# Patient Record
Sex: Male | Born: 1950
Health system: Southern US, Community
[De-identification: ages and names within clinical notes are randomized; demographics above are authoritative.]

## PROBLEM LIST (undated history)

## (undated) DIAGNOSIS — R011 Cardiac murmur, unspecified: Secondary | ICD-10-CM

## (undated) DIAGNOSIS — R51 Headache: Secondary | ICD-10-CM

## (undated) DIAGNOSIS — R002 Palpitations: Secondary | ICD-10-CM

## (undated) DIAGNOSIS — F329 Major depressive disorder, single episode, unspecified: Secondary | ICD-10-CM

## (undated) DIAGNOSIS — I10 Essential (primary) hypertension: Secondary | ICD-10-CM

## (undated) DIAGNOSIS — R52 Pain, unspecified: Secondary | ICD-10-CM

## (undated) DIAGNOSIS — F32A Depression, unspecified: Secondary | ICD-10-CM

## (undated) DIAGNOSIS — I480 Paroxysmal atrial fibrillation: Secondary | ICD-10-CM

## (undated) DIAGNOSIS — I4892 Unspecified atrial flutter: Secondary | ICD-10-CM

## (undated) DIAGNOSIS — F419 Anxiety disorder, unspecified: Secondary | ICD-10-CM

## (undated) DIAGNOSIS — J45909 Unspecified asthma, uncomplicated: Secondary | ICD-10-CM

## (undated) DIAGNOSIS — K219 Gastro-esophageal reflux disease without esophagitis: Secondary | ICD-10-CM

## (undated) DIAGNOSIS — C801 Malignant (primary) neoplasm, unspecified: Secondary | ICD-10-CM

## (undated) DIAGNOSIS — Z9889 Other specified postprocedural states: Secondary | ICD-10-CM

## (undated) DIAGNOSIS — R112 Nausea with vomiting, unspecified: Secondary | ICD-10-CM

## (undated) DIAGNOSIS — D492 Neoplasm of unspecified behavior of bone, soft tissue, and skin: Secondary | ICD-10-CM

## (undated) HISTORY — PX: HERNIA REPAIR: SHX51

## (undated) HISTORY — DX: Paroxysmal atrial fibrillation: I48.0

## (undated) HISTORY — PX: OTHER SURGICAL HISTORY: SHX169

---
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
## 1999-05-26 ENCOUNTER — Encounter: Payer: Self-pay | Admitting: Emergency Medicine

## 1999-05-26 ENCOUNTER — Emergency Department (HOSPITAL_COMMUNITY): Admission: EM | Admit: 1999-05-26 | Discharge: 1999-05-26 | Payer: Self-pay | Admitting: *Deleted

## 2007-05-11 ENCOUNTER — Encounter (INDEPENDENT_AMBULATORY_CARE_PROVIDER_SITE_OTHER): Payer: Self-pay | Admitting: Surgery

## 2007-05-11 ENCOUNTER — Ambulatory Visit (HOSPITAL_BASED_OUTPATIENT_CLINIC_OR_DEPARTMENT_OTHER): Admission: RE | Admit: 2007-05-11 | Discharge: 2007-05-11 | Payer: Self-pay | Admitting: Surgery

## 2009-01-30 ENCOUNTER — Emergency Department (HOSPITAL_COMMUNITY): Admission: EM | Admit: 2009-01-30 | Discharge: 2009-01-30 | Payer: Self-pay | Admitting: Emergency Medicine

## 2009-05-27 ENCOUNTER — Emergency Department (HOSPITAL_COMMUNITY): Admission: EM | Admit: 2009-05-27 | Discharge: 2009-05-28 | Payer: Self-pay | Admitting: Emergency Medicine

## 2010-12-26 LAB — CBC
HCT: 39.7 % (ref 39.0–52.0)
Hemoglobin: 13.5 g/dL (ref 13.0–17.0)
MCHC: 34 g/dL (ref 30.0–36.0)
MCV: 93.2 fL (ref 78.0–100.0)
Platelets: 124 10*3/uL — ABNORMAL LOW (ref 150–400)
RBC: 4.26 MIL/uL (ref 4.22–5.81)
RDW: 12.6 % (ref 11.5–15.5)
WBC: 6.3 10*3/uL (ref 4.0–10.5)

## 2010-12-26 LAB — URINALYSIS, ROUTINE W REFLEX MICROSCOPIC
Bilirubin Urine: NEGATIVE
Glucose, UA: NEGATIVE mg/dL
Hgb urine dipstick: NEGATIVE
Ketones, ur: NEGATIVE mg/dL
Nitrite: NEGATIVE
Protein, ur: NEGATIVE mg/dL
Specific Gravity, Urine: 1.016 (ref 1.005–1.030)
Urobilinogen, UA: 0.2 mg/dL (ref 0.0–1.0)
pH: 6 (ref 5.0–8.0)

## 2010-12-26 LAB — DIFFERENTIAL
Basophils Absolute: 0 10*3/uL (ref 0.0–0.1)
Basophils Relative: 0 % (ref 0–1)
Eosinophils Absolute: 0.1 10*3/uL (ref 0.0–0.7)
Eosinophils Relative: 1 % (ref 0–5)
Lymphocytes Relative: 21 % (ref 12–46)
Lymphs Abs: 1.3 10*3/uL (ref 0.7–4.0)
Monocytes Absolute: 0.3 10*3/uL (ref 0.1–1.0)
Monocytes Relative: 5 % (ref 3–12)
Neutro Abs: 4.6 10*3/uL (ref 1.7–7.7)
Neutrophils Relative %: 73 % (ref 43–77)

## 2010-12-26 LAB — RAPID URINE DRUG SCREEN, HOSP PERFORMED
Amphetamines: NOT DETECTED
Barbiturates: NOT DETECTED
Benzodiazepines: POSITIVE — AB
Cocaine: NOT DETECTED
Opiates: NOT DETECTED
Tetrahydrocannabinol: NOT DETECTED

## 2010-12-26 LAB — COMPREHENSIVE METABOLIC PANEL
ALT: 17 U/L (ref 0–53)
AST: 24 U/L (ref 0–37)
Albumin: 4.2 g/dL (ref 3.5–5.2)
Alkaline Phosphatase: 44 U/L (ref 39–117)
BUN: 13 mg/dL (ref 6–23)
CO2: 27 mEq/L (ref 19–32)
Calcium: 8.9 mg/dL (ref 8.4–10.5)
Chloride: 103 mEq/L (ref 96–112)
Creatinine, Ser: 0.89 mg/dL (ref 0.4–1.5)
GFR calc Af Amer: >60 mL/min (ref >60)
GFR calc non Af Amer: >60 mL/min (ref >60)
Glucose, Bld: 109 mg/dL — ABNORMAL HIGH (ref 70–99)
Potassium: 3.4 mEq/L — ABNORMAL LOW (ref 3.5–5.1)
Sodium: 134 mEq/L — ABNORMAL LOW (ref 135–145)
Total Bilirubin: 0.9 mg/dL (ref 0.3–1.2)
Total Protein: 7.3 g/dL (ref 6.0–8.3)

## 2010-12-26 LAB — ETHANOL: Alcohol, Ethyl (B): <5 mg/dL (ref 0–10)

## 2010-12-30 LAB — CK TOTAL AND CKMB (NOT AT ARMC)
CK, MB: 3.1 ng/mL (ref 0.3–4.0)
CK, MB: 3.5 ng/mL (ref 0.3–4.0)
Relative Index: 2.9 — ABNORMAL HIGH (ref 0.0–2.5)
Relative Index: 2.9 — ABNORMAL HIGH (ref 0.0–2.5)
Total CK: 107 U/L (ref 7–232)
Total CK: 120 U/L (ref 7–232)

## 2010-12-30 LAB — DIFFERENTIAL
Basophils Absolute: 0 10*3/uL (ref 0.0–0.1)
Basophils Relative: 0 % (ref 0–1)
Eosinophils Absolute: 0 10*3/uL (ref 0.0–0.7)
Eosinophils Relative: 1 % (ref 0–5)
Lymphocytes Relative: 26 % (ref 12–46)
Lymphs Abs: 1.2 10*3/uL (ref 0.7–4.0)
Monocytes Absolute: 0.3 10*3/uL (ref 0.1–1.0)
Monocytes Relative: 7 % (ref 3–12)
Neutro Abs: 3.1 10*3/uL (ref 1.7–7.7)
Neutrophils Relative %: 66 % (ref 43–77)

## 2010-12-30 LAB — COMPREHENSIVE METABOLIC PANEL
ALT: 20 U/L (ref 0–53)
AST: 26 U/L (ref 0–37)
Albumin: 4.2 g/dL (ref 3.5–5.2)
Alkaline Phosphatase: 43 U/L (ref 39–117)
BUN: 12 mg/dL (ref 6–23)
CO2: 30 mEq/L (ref 19–32)
Calcium: 9.4 mg/dL (ref 8.4–10.5)
Chloride: 105 mEq/L (ref 96–112)
Creatinine, Ser: 0.98 mg/dL (ref 0.4–1.5)
GFR calc Af Amer: >60 mL/min (ref >60)
GFR calc non Af Amer: >60 mL/min (ref >60)
Glucose, Bld: 95 mg/dL (ref 70–99)
Potassium: 3.6 mEq/L (ref 3.5–5.1)
Sodium: 141 mEq/L (ref 135–145)
Total Bilirubin: 1.2 mg/dL (ref 0.3–1.2)
Total Protein: 7 g/dL (ref 6.0–8.3)

## 2010-12-30 LAB — TROPONIN I
Troponin I: 0.01 ng/mL (ref 0.00–0.06)
Troponin I: <0.01 ng/mL (ref 0.00–0.06)

## 2010-12-30 LAB — CBC
HCT: 40 % (ref 39.0–52.0)
Hemoglobin: 13.9 g/dL (ref 13.0–17.0)
MCHC: 34.7 g/dL (ref 30.0–36.0)
MCV: 91.2 fL (ref 78.0–100.0)
Platelets: 127 10*3/uL — ABNORMAL LOW (ref 150–400)
RBC: 4.39 MIL/uL (ref 4.22–5.81)
RDW: 12.7 % (ref 11.5–15.5)
WBC: 4.7 10*3/uL (ref 4.0–10.5)

## 2011-02-03 NOTE — Op Note (Signed)
NAME:  Tommy [name], Tommy [name]                ACCOUNT NO.:  [id number]   MEDICAL RECORD NO.:  [id number]          PATIENT TYPE:  AMB   LOCATION:  DSC                          FACILITY:  [facility]   PHYSICIAN:  Thomas A. Cornett, M.D.DATE OF BIRTH:  Apr 02, 1951   DATE OF PROCEDURE:  05/11/2007  DATE OF DISCHARGE:                               OPERATIVE REPORT   PREOPERATIVE DIAGNOSIS:  Left axillary mass, probable lipoma, measuring  4 x 5 cm.   POSTOPERATIVE DIAGNOSIS:  Left axillary mass, probable lipoma, measuring  4 x 5 cm.   PROCEDURE:  Excision of left axillary mass.   SURGEON:  Harriette Bouillon, M.D.   ASSISTANT:  None.   ANESTHESIA:  MAC with 0.25% Sensorcaine local.   ESTIMATED BLOOD LOSS:  5 mL.   SPECIMEN:  4 x 5 cm fatty mass from left axilla probable lipoma.   DRAINS:  None.   INDICATIONS FOR PROCEDURE:  The patient is a 60 year old male with a  slowly enlarging mass in his left axilla.  This has been causing him  some symptoms.  He wished to have it removed today.  Informed consent  was obtained.   DESCRIPTION OF PROCEDURE:  The patient was brought to the operating  room, placed supine.  After induction of MAC anesthesia, left axilla was  prepped and draped in sterile fashion.  0.25% Sensorcaine was used and a  lobular mass was in the anterior axillary line.  Incision was made over  it.  Dissection was carried in the subcu fat and what appeared to be a  lipoma was excised measuring 4 x 5 cm.  Hemostasis was achieved with  cautery.  Wound was closed in layers with 3-0 and Vicryl 4-0 Monocryl  subcuticular stitch.  Dermabond was placed as a dressing.  All final  counts of sponge, needle and instruments found to be correct this  portion of case.  The patient was awoke taken to recovery in  satisfactory condition.      Thomas A. Cornett, M.D.  Electronically Signed     TAC/MEDQ  D:  05/11/2007  T:  05/12/2007  Job:  [id number]

## 2011-02-03 NOTE — Consult Note (Signed)
NAME:  Tommy [name], Tommy [name]                ACCOUNT NO.:  [id number]   MEDICAL RECORD NO.:  [id number]          PATIENT TYPE:  EMS   LOCATION:  ED                           FACILITY:  [facility]   PHYSICIAN:  Thereasa Solo. Little, M.D. DATE OF BIRTH:  August 18, 1951   DATE OF CONSULTATION:  01/30/2009  DATE OF DISCHARGE:  01/30/2009                                 CONSULTATION   HISTORY OF PRESENT ILLNESS:  This 60 year old male presents to [facility] Emergency Room with palpitations.  He has negative troponins and  low CK with slightly high MBs for relative index of greater than 2.5.   His history dates back about 10 years ago when he began having episodes  of palpitations.  He was placed on brand name Toprol and did wonderful  with no recurrent problems.  He recently switched from Toprol to  metoprolol subsequently, and when this transition was made, he began  noticing more ectopy.  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  He became anxious  with this.  It stopped on its own and he was fine last night.  He slept  appropriately last night, felt good this morning, and then for no  particular reason began having palpitations this afternoon.  He was  actually able to work out last night with no symptoms.  The palpitations  he describe sound more like occasional PVCs, nothing that appears to be  fast.  No associated dizziness or lightheadedness.  He has had no  exertional chest pain, he has no prior history of coronary artery  disease.  He is 6 feet 7 inches and his mother was also tall, but there  is no history of Marfan syndrome in his family.  His mother had heart  disease including coronary artery disease and atrial fibrillation, but  all these started in her 76s.   He had problems in the past with stress, had been on Zoloft ranging from  50-125 mg a day.  At the  higher doses, he had pressure sensation in his  bladder and he recently tapered himself off the 50 mg Zoloft.  He has  been taking low-dose triamterene intermittently, recently.   He is on significant emotional stressors with his mother dying in  February.  His financial situation is rather bleak.  He lives with his  brother and is looking for a girlfriend on the internet.   He is on appropriate reduced salt, reduced fat diet.  He does not use  caffeine very often, but he has noticed in the past when he drinks  caffeinated beverages he feels hyped up.  He did eat chocolate candy  yesterday at [employer], and a coke.   PAST HISTORY:  Two benign tumors, 1 from his arm and 1 from his leg; and  bilateral hernia repairs x2, one has mesh.   He has PSA issues, 7.1 is the highest, it is currently 6.5.  He has seen  Dr. Annabell Howells in the past for this.   As a child, he had asthma, and was diagnosed about 10 years ago as  having some mild degree of COPD/asthma by a chest x-ray on hospital.  I  have no documentation of this.   REVIEW OF SYSTEMS:  He has had no lower extremity edema.  He does not  have sclerodactyly.  He has never been worked up for [employer].  He has  no evidence of any heart murmur.  He has been physically unlimited,  however.  He denies sleep apnea.  Does not have a productive cough,  fever, chills, or night sweats.  Normal bowel habits.  No blood in his  bowel movements.  If he eats red meat and when he takes  high-dose  Zoloft, he occasionally has nosebleeds.  He gives a history of migraine  headaches also.   PHYSICAL EXAMINATION:  VITAL SIGNS:  His initial blood pressure in the  emergency room was 150/95.  It was checked about 2 hours later, and it  is down to 114/75 with no therapy.  His initial heart rate was 91.  He  had also slowed to 64.  Respiratory rate was 18.  His oxygen saturation  was 100%.  GENERAL:  He has hyperpigmentation over his lower extremities at the   level of the socks.  He has no pitting edema.  He has excellent pulses  in the upper and lower extremities.  He is not double-jointed.  His  carotids are without bruits.  NECK:  Supple.  There was no anterior cervical adenopathy.  His thyroid  was not palpable.  LUNGS:  Clear to auscultation and forced expiration did not induce a  wheeze.  CARDIAC:  Both supine and sitting failed to show a murmur.  He did have  what sounds like an intermittent S4 gallop.  ABDOMEN:  Soft.  No bruits.  No organomegaly.  Liver is not palpable.  He had no rashes.  NEUROLOGIC:  Other than being anxious, he was normal.   His EKG shows a sinus rhythm with a rate of 65 and he had a single  isolated PVC.   On personal review of his lab work, he has a potassium of 3.6, BUN and  creatinine of 12 and 0.98 respectively.  His first troponin was less  than 0.1.  His CK was 120 with an MB of 3.  Repeat CK was 107 with an MB  of 3 and again a troponin of 0.1.   ASSESSMENT:  1. Benign palpitations which seems to be chronic, perhaps exacerbated      by some stress and/or caffeine.  I have asked him to go back on his      Toprol-XL 50 mg once a day, brand name only.  2. Stress.  With his mother's recent death and his social situation, I      recommended to go back on Zoloft 50 mg which he has at home.  In      addition, he has Tranxene 3.75 mg and I asked him to increase this      to 7.5 mg once a day, particularly at bedtime, so he can rest      appropriately.   At some point in the new feature, he will probably need an  echocardiogram and a cardiac stress test.  My only concern is because of  his height is that he could potentially be marfanoid but he has no other  stigmata and I  did not appreciate any kind of murmur.   The patient has now reported allergies.  His medications at home are  blood pressure metoprolol succinate 50 mg once a day, Zoloft 50 mg, but  he has not taken in several weeks, and Tranxene 3.75  p.r.n.  He is to  follow up with Dr. Juleen China.           ______________________________  Thereasa Solo. Little, M.D.     ABL/MEDQ  D:  01/30/2009  T:  01/31/2009  Job:  [id number]   cc:   [facility]   Dr. Adela Lank

## 2011-07-03 LAB — CBC
HCT: 37.9 — ABNORMAL LOW
Hemoglobin: 13.1
MCHC: 34.7
MCV: 90.9
Platelets: 145 — ABNORMAL LOW
RBC: 4.17 — ABNORMAL LOW
RDW: 12.9
WBC: 4.8

## 2011-07-03 LAB — DIFFERENTIAL
Basophils Absolute: 0
Basophils Relative: 0
Eosinophils Absolute: 0.1
Eosinophils Relative: 2
Lymphocytes Relative: 30
Lymphs Abs: 1.4
Monocytes Absolute: 0.3
Monocytes Relative: 7
Neutro Abs: 2.9
Neutrophils Relative %: 61

## 2011-07-03 LAB — BASIC METABOLIC PANEL
BUN: 13
CO2: 28
Calcium: 9.1
Chloride: 104
Creatinine, Ser: 0.9
GFR calc Af Amer: >60
GFR calc non Af Amer: >60
Glucose, Bld: 95
Potassium: 4
Sodium: 139

## 2012-06-19 ENCOUNTER — Emergency Department (HOSPITAL_COMMUNITY)
Admission: EM | Admit: 2012-06-19 | Discharge: 2012-06-19 | Disposition: A | Discharge status: Home / Self Care | Payer: Worker's Compensation | Attending: Emergency Medicine | Admitting: Emergency Medicine

## 2012-06-19 ENCOUNTER — Emergency Department (HOSPITAL_COMMUNITY): Payer: Worker's Compensation

## 2012-06-19 ENCOUNTER — Encounter (HOSPITAL_COMMUNITY): Payer: Self-pay | Admitting: Emergency Medicine

## 2012-06-19 DIAGNOSIS — X503XXA Overexertion from repetitive movements, initial encounter: Secondary | ICD-10-CM | POA: Insufficient documentation

## 2012-06-19 DIAGNOSIS — R002 Palpitations: Secondary | ICD-10-CM | POA: Insufficient documentation

## 2012-06-19 DIAGNOSIS — Z87891 Personal history of nicotine dependence: Secondary | ICD-10-CM | POA: Insufficient documentation

## 2012-06-19 DIAGNOSIS — S2341XA Sprain of ribs, initial encounter: Secondary | ICD-10-CM

## 2012-06-19 DIAGNOSIS — I10 Essential (primary) hypertension: Secondary | ICD-10-CM | POA: Insufficient documentation

## 2012-06-19 DIAGNOSIS — J45909 Unspecified asthma, uncomplicated: Secondary | ICD-10-CM | POA: Insufficient documentation

## 2012-06-19 HISTORY — DX: Unspecified asthma, uncomplicated: J45.909

## 2012-06-19 HISTORY — DX: Essential (primary) hypertension: I10

## 2012-06-19 HISTORY — DX: Neoplasm of unspecified behavior of bone, soft tissue, and skin: D49.2

## 2012-06-19 HISTORY — DX: Palpitations: R00.2

## 2012-06-19 NOTE — ED Notes (Signed)
Pt reports this as a work related injury.

## 2012-06-19 NOTE — ED Provider Notes (Signed)
History     CSN: [id number]  Arrival date & time 06/19/12  1532   First MD Initiated Contact with Patient 06/19/12 1723      Chief Complaint  Patient presents with  . Rib Injury    (Consider location/radiation/quality/duration/timing/severity/associated sxs/prior treatment) HPI Comments: 61 year old male presents emergency department with left-sided rib pain since Friday after moving heavy boxes at work. States he was pushing a box for her move left arm when he felt a pain in the left side of his ribs. Since then the pain has been intermittent, worse with taking a deep breath in. Pain rated for out of 10. He try taking ibuprofen and Tylenol with some relief. Denies any shortness of breath, chest pain, nausea, vomiting, diaphoresis, rashes, lightheadedness, dizziness, fever or chills. No color change or bruising around his rib area. Admits to being diagnosed with COPD about 3-4 years ago. He is a nonsmoker.  The history is provided by the patient.    Past Medical History  Diagnosis Date  . Asthma   . Hypertension   . Palpitations   . Bone tumor     Past Surgical History  Procedure Date  . Bone tumor excision     No family history on file.  History  Substance Use Topics  . Smoking status: Former Smoker    Types: Cigarettes, Pipe, Cigars    Quit date: 09/21/1970  . Smokeless tobacco: Never Used  . Alcohol Use: Yes     Occassionally       Review of Systems  Constitutional: Negative for fever, chills, diaphoresis and activity change.  HENT: Negative for neck pain and neck stiffness.   Eyes: Negative for visual disturbance.  Respiratory: Negative for cough, chest tightness and shortness of breath.   Cardiovascular: Negative for chest pain.  Gastrointestinal: Negative for nausea, vomiting and abdominal pain.  Musculoskeletal:       Rib pain  Skin: Negative for color change and rash.  Neurological: Negative for weakness and light-headedness.  Psychiatric/Behavioral:  Negative for confusion.    Allergies  Paxil  Home Medications   Current Outpatient Rx  Name Route Sig Dispense Refill  . CLORAZEPATE DIPOTASSIUM 3.75 MG PO TABS Oral Take 3.75 mg by mouth 2 (two) times daily as needed. For anxiety    . METOPROLOL SUCCINATE ER 50 MG PO TB24 Oral Take 50 mg by mouth 2 (two) times daily.    Marland Kitchen OMEPRAZOLE 20 MG PO CPDR Oral Take 20 mg by mouth daily as needed. For acid reflux      BP 125/79  Pulse 62  Temp 97.8 F (36.6 C) (Oral)  Resp 18  SpO2 100%  Physical Exam  Constitutional: He is oriented to person, place, and time. He appears well-developed and well-nourished. No distress.  HENT:  Head: Normocephalic and atraumatic.  Eyes: Conjunctivae normal and EOM are normal. Pupils are equal, round, and reactive to light.  Neck: Normal range of motion. Neck supple.  Cardiovascular: Normal rate, regular rhythm, normal heart sounds and intact distal pulses.   Pulmonary/Chest: Effort normal and breath sounds normal. No respiratory distress. He has no decreased breath sounds. He exhibits tenderness (over left lateral 11th rib without evidence of step off).  Abdominal: Soft. Bowel sounds are normal. There is no tenderness.  Musculoskeletal: Normal range of motion. He exhibits no edema.  Neurological: He is alert and oriented to person, place, and time.  Skin: Skin is warm and dry. No bruising, no ecchymosis and no rash noted. He is  not diaphoretic. No erythema.  Psychiatric: He has a normal mood and affect. His behavior is normal.    ED Course  Procedures (including critical care time)  Labs Reviewed - No data to display Dg Chest 1 View  06/19/2012  *RADIOLOGY REPORT*  Clinical Data: Evaluate for right lung nodule.  EKG leads and overlying artifacts removed.  CHEST - 1 VIEW  Comparison: Earlier today at 1723 hours  Findings: 1804 hours.  Midline trachea.  Normal heart size.  The costophrenic angles are excluded, but there is no pleural fluid on the exam of  earlier today. No pneumothorax.  Underlying hyperinflation.  The nodular density questioned on the prior exam is not persistent and was artifactual.  Lungs are clear.  IMPRESSION: No evidence of persistent lung nodule.  The density on the prior exam was artifactual.   Original Report Authenticated By: Consuello Bossier, M.D.    Dg Ribs Unilateral W/chest Left  06/19/2012  *RADIOLOGY REPORT*  Clinical Data: Rib injury.  Left-sided pain.  LEFT RIBS AND CHEST - 3+ VIEW  Comparison: Chest film 01/30/2009  Findings: 2 frontal views of the chest demonstrate hyperinflation suspicious for COPD. Midline trachea.  Normal heart size and mediastinal contours. No pleural effusion or pneumothorax.  There is a possible artifactual nodular density projecting over the right lung apex.  This measures 1.1 cm.  There are also EKG lead artifacts projecting over the left upper and lower lungs on the frontal.  Lungs otherwise clear.  4 views of left sided ribs demonstrate no displaced rib fracture.  IMPRESSION:  1.  No displaced rib fracture or pneumothorax. 2. Mild underlying hyperinflation. 3.  Possible artifactual density projecting over the right lung apex.  Not readily apparent on the prior exam.  Consider repeat frontal film after removal of all overlying artifacts versus further characterization with non emergent chest CT to exclude nodule.   Original Report Authenticated By: Consuello Bossier, M.D.    1. Rib sprain       MDM  61 year old male with rib sprain/strain. No rib fracture or pneumothorax seen on x-ray. Artifact on first x-ray no longer present after removal EKG leads. Breath sounds unremarkable. He is in no apparent distress. Discussed importance of taking deep breaths. Ibuprofen and Tylenol helps with his pain, so he will continue to take those. I advised use ice and avoid any hard physical activity. Close return precautions discussed.       Trevor Mace, PA-C 06/19/12 1914

## 2012-06-19 NOTE — ED Notes (Addendum)
Pt reports moving heavy boxes at work on Friday morning and chest hit a surrounding table. Pt denies N/V/D. Pt states it is painful to take deep breaths, lungs CTA. NAD.

## 2012-06-29 NOTE — ED Provider Notes (Signed)
Medical screening examination/treatment/procedure(s) were performed by non-physician practitioner and as supervising physician I was immediately available for consultation/collaboration.  Jones Skene, M.D.     Jones Skene, MD 06/29/12 1421

## 2013-01-28 ENCOUNTER — Encounter (HOSPITAL_COMMUNITY): Payer: Self-pay | Admitting: *Deleted

## 2013-01-28 ENCOUNTER — Emergency Department (HOSPITAL_COMMUNITY)
Admission: EM | Admit: 2013-01-28 | Discharge: 2013-01-28 | Disposition: A | Discharge status: Home / Self Care | Payer: BC Managed Care – PPO | Attending: Emergency Medicine | Admitting: Emergency Medicine

## 2013-01-28 DIAGNOSIS — Z8679 Personal history of other diseases of the circulatory system: Secondary | ICD-10-CM | POA: Insufficient documentation

## 2013-01-28 DIAGNOSIS — Z79899 Other long term (current) drug therapy: Secondary | ICD-10-CM | POA: Insufficient documentation

## 2013-01-28 DIAGNOSIS — J45909 Unspecified asthma, uncomplicated: Secondary | ICD-10-CM | POA: Insufficient documentation

## 2013-01-28 DIAGNOSIS — Z87891 Personal history of nicotine dependence: Secondary | ICD-10-CM | POA: Insufficient documentation

## 2013-01-28 DIAGNOSIS — R339 Retention of urine, unspecified: Secondary | ICD-10-CM | POA: Insufficient documentation

## 2013-01-28 DIAGNOSIS — I10 Essential (primary) hypertension: Secondary | ICD-10-CM | POA: Insufficient documentation

## 2013-01-28 DIAGNOSIS — Z85830 Personal history of malignant neoplasm of bone: Secondary | ICD-10-CM | POA: Insufficient documentation

## 2013-01-28 LAB — URINALYSIS, ROUTINE W REFLEX MICROSCOPIC
Bilirubin Urine: NEGATIVE
Glucose, UA: NEGATIVE mg/dL
Ketones, ur: NEGATIVE mg/dL
Leukocytes, UA: NEGATIVE
Nitrite: NEGATIVE
Protein, ur: NEGATIVE mg/dL
Specific Gravity, Urine: 1.015 (ref 1.005–1.030)
Urobilinogen, UA: 0.2 mg/dL (ref 0.0–1.0)
pH: 6 (ref 5.0–8.0)

## 2013-01-28 LAB — POCT I-STAT TROPONIN I: Troponin i, poc: 0 ng/mL (ref 0.00–0.08)

## 2013-01-28 LAB — POCT I-STAT, CHEM 8
BUN: 18 mg/dL (ref 6–23)
Calcium, Ion: 1.19 mmol/L (ref 1.12–1.23)
Chloride: 101 mEq/L (ref 96–112)
Creatinine, Ser: 0.9 mg/dL (ref 0.50–1.35)
Glucose, Bld: 93 mg/dL (ref 70–99)
HCT: 39 % (ref 39.0–52.0)
Hemoglobin: 13.3 g/dL (ref 13.0–17.0)
Potassium: 3.5 mEq/L (ref 3.5–5.1)
Sodium: 141 mEq/L (ref 135–145)
TCO2: 31 mmol/L (ref 0–100)

## 2013-01-28 LAB — URINE MICROSCOPIC-ADD ON

## 2013-01-28 NOTE — ED Notes (Addendum)
Biopsy on Thursday to R/O Prostate CA, urinary symptoms of pain and frequency since, at 0300 this am, symptoms worsened. Urine with blood that clears with urination. Low back pain note

## 2013-01-28 NOTE — ED Notes (Signed)
Patient's abdomen with a bulging noted before foley placed. Currently flat and nontender upon palpation.

## 2013-01-28 NOTE — ED Provider Notes (Signed)
History     CSN: [id number]  Arrival date & time 01/28/13  0804   First MD Initiated Contact with Patient 01/28/13 [phone]      Chief Complaint  Patient presents with  . Urinary Frequency    (Consider location/radiation/quality/duration/timing/severity/associated sxs/prior treatment) HPI Pt presenting with difficulty urinating and blood in urine since prostate biopsy 2 days ago.  He has intermittently seen blood in urine, no blood clots.  No fever/chills, no vomiting.  Has been having frequent small voids since procedure.  Since 3am this morning he has not been able to void.  There are no other associated systemic symptoms, there are no other alleviating or modifying factors.   Past Medical History  Diagnosis Date  . Asthma   . Hypertension   . Palpitations   . Bone tumor     Past Surgical History  Procedure Laterality Date  . Bone tumor excision      No family history on file.  History  Substance Use Topics  . Smoking status: Former Smoker    Types: Cigarettes, Pipe, Cigars    Quit date: 09/21/1970  . Smokeless tobacco: Never Used  . Alcohol Use: Yes     Comment: Occassionally       Review of Systems ROS reviewed and all otherwise negative except for mentioned in HPI  Allergies  Ibuprofen and Paxil  Home Medications   Current Outpatient Rx  Name  Route  Sig  Dispense  Refill  . acetaminophen (TYLENOL) 500 MG tablet   Oral   Take 500 mg by mouth every 6 (six) hours as needed for pain.          . clorazepate (TRANXENE) 3.75 MG tablet   Oral   Take 3.75 mg by mouth 2 (two) times daily as needed. For anxiety         . finasteride (PROSCAR) 5 MG tablet   Oral   Take 5 mg by mouth daily.         Marland Kitchen HYDROcodone-acetaminophen (VICODIN) 5-500 MG per tablet   Oral   Take 1 tablet by mouth every 6 (six) hours as needed for pain.         . metoprolol succinate (TOPROL-XL) 50 MG 24 hr tablet   Oral   Take 50 mg by mouth 2 (two) times daily.          . Multiple Vitamin (MULTIVITAMIN WITH MINERALS) TABS   Oral   Take 1 tablet by mouth daily.         Marland Kitchen omeprazole (PRILOSEC) 20 MG capsule   Oral   Take 20 mg by mouth daily as needed. For acid reflux         . Tafluprost (ZIOPTAN) 0.0015 % SOLN   Both Eyes   Place 1 drop into both eyes daily.         . Travoprost, BAK Free, (TRAVATAN) 0.004 % SOLN ophthalmic solution   Both Eyes   Place 1 drop into both eyes at bedtime.           BP 113/72  Pulse 54  Temp(Src) 98.1 F (36.7 C)  SpO2 100% Vitals reviewed Physical Exam Physical Examination: General appearance - alert, well appearing, and in no distress Mental status - alert, oriented to person, place, and time Eyes - no conjunctival injection, no scleral icterus Mouth - mucous membranes moist, pharynx normal without lesions Chest - clear to auscultation, no wheezes, rales or rhonchi, symmetric air entry Heart - normal rate, regular  rhythm, normal S1, S2, no murmurs, rubs, clicks or gallops Abdomen - soft, nontender, nondistended, no masses or organomegaly Extremities - peripheral pulses normal, no pedal edema, no clubbing or cyanosis Skin - normal coloration and turgor, no rashes  ED Course  Procedures (including critical care time)  Labs Reviewed  URINALYSIS, ROUTINE W REFLEX MICROSCOPIC - Abnormal; Notable for the following:    Hgb urine dipstick MODERATE (*)    All other components within normal limits  URINE CULTURE  URINE MICROSCOPIC-ADD ON  POCT I-STAT, CHEM 8  POCT I-STAT TROPONIN I   No results found.   1. Urinary retention       MDM  Pt with urinary retention and intermittent gross hematuria after prostate biopsy 2 days ago.  Pt feels much improved after foley catheter placed.  No gross blood in urine.  No sign of renal insufficiency, or UTI. Urine culture pending.  Pt discharged adn advised close f/u with urology.  Discharged with strict return precautions.  Pt agreeable with  plan.        Ethelda Chick, MD 01/29/13 [phone]

## 2013-01-29 LAB — URINE CULTURE
Colony Count: NO GROWTH
Culture: NO GROWTH

## 2013-02-09 ENCOUNTER — Emergency Department (HOSPITAL_COMMUNITY)
Admission: EM | Admit: 2013-02-09 | Discharge: 2013-02-09 | Disposition: A | Discharge status: Home / Self Care | Payer: BC Managed Care – PPO | Attending: Emergency Medicine | Admitting: Emergency Medicine

## 2013-02-09 ENCOUNTER — Encounter (HOSPITAL_COMMUNITY): Payer: Self-pay | Admitting: Emergency Medicine

## 2013-02-09 DIAGNOSIS — R339 Retention of urine, unspecified: Secondary | ICD-10-CM

## 2013-02-09 DIAGNOSIS — R338 Other retention of urine: Secondary | ICD-10-CM | POA: Insufficient documentation

## 2013-02-09 DIAGNOSIS — Z87891 Personal history of nicotine dependence: Secondary | ICD-10-CM | POA: Insufficient documentation

## 2013-02-09 DIAGNOSIS — Z8679 Personal history of other diseases of the circulatory system: Secondary | ICD-10-CM | POA: Insufficient documentation

## 2013-02-09 DIAGNOSIS — J45909 Unspecified asthma, uncomplicated: Secondary | ICD-10-CM | POA: Insufficient documentation

## 2013-02-09 DIAGNOSIS — Z8739 Personal history of other diseases of the musculoskeletal system and connective tissue: Secondary | ICD-10-CM | POA: Insufficient documentation

## 2013-02-09 DIAGNOSIS — C61 Malignant neoplasm of prostate: Secondary | ICD-10-CM | POA: Insufficient documentation

## 2013-02-09 DIAGNOSIS — I10 Essential (primary) hypertension: Secondary | ICD-10-CM | POA: Insufficient documentation

## 2013-02-09 DIAGNOSIS — Z79899 Other long term (current) drug therapy: Secondary | ICD-10-CM | POA: Insufficient documentation

## 2013-02-09 HISTORY — DX: Malignant (primary) neoplasm, unspecified: C80.1

## 2013-02-09 LAB — URINALYSIS, ROUTINE W REFLEX MICROSCOPIC
Bilirubin Urine: NEGATIVE
Glucose, UA: NEGATIVE mg/dL
Ketones, ur: NEGATIVE mg/dL
Leukocytes, UA: NEGATIVE
Nitrite: NEGATIVE
Protein, ur: NEGATIVE mg/dL
Specific Gravity, Urine: 1.017 (ref 1.005–1.030)
Urobilinogen, UA: 0.2 mg/dL (ref 0.0–1.0)
pH: 5.5 (ref 5.0–8.0)

## 2013-02-09 LAB — URINE MICROSCOPIC-ADD ON

## 2013-02-09 MED ORDER — TAMSULOSIN HCL 0.4 MG PO CAPS: 0.4000 mg | ORAL_CAPSULE | Freq: Every day | ORAL | Status: DC

## 2013-02-09 NOTE — ED Notes (Signed)
MD at bedside. 

## 2013-02-09 NOTE — ED Provider Notes (Signed)
History     CSN: [id number]  Arrival date & time 02/09/13  4098   First MD Initiated Contact with Patient 02/09/13 [phone]      Chief Complaint  Patient presents with  . Urinary Retention    (Consider location/radiation/quality/duration/timing/severity/associated sxs/prior treatment) HPI Patient presents to 12 days after a recent ED evaluation for similar occurrence. He states that approximately 6 hours ago he began having difficulty initiating a urinary stream all and developed mild lower abdominal discomfort. Since onset symptoms have been worsening. No concurrent fever, chills, vomiting, confusion, disorientation, chest pain, dyspnea. No relief with taking Flomax. No clear exacerbating factor. The patient has a history of BPH, recently diagnosed prostate cancer. He saw his urologist once following his recent emergency department evaluation for urinary retention with placement of a Foley catheter.  Past Medical History  Diagnosis Date  . Asthma   . Hypertension   . Palpitations   . Bone tumor   . Cancer     Past Surgical History  Procedure Laterality Date  . Bone tumor excision      No family history on file.  History  Substance Use Topics  . Smoking status: Former Smoker    Types: Cigarettes, Pipe, Cigars    Quit date: 09/21/1970  . Smokeless tobacco: Never Used  . Alcohol Use: Yes     Comment: Occassionally       Review of Systems  Constitutional:       Per HPI, otherwise negative  HENT:       Per HPI, otherwise negative  Respiratory:       Per HPI, otherwise negative  Cardiovascular:       Per HPI, otherwise negative  Gastrointestinal: Negative for vomiting.  Endocrine:       Negative aside from HPI  Genitourinary:       Neg aside from HPI   Musculoskeletal:       Per HPI, otherwise negative  Skin: Negative.   Neurological: Negative for syncope.    Allergies  Ibuprofen and Paxil  Home Medications   Current Outpatient Rx  Name  Route   Sig  Dispense  Refill  . acetaminophen (TYLENOL) 500 MG tablet   Oral   Take 500 mg by mouth every 6 (six) hours as needed for pain.          . clorazepate (TRANXENE) 3.75 MG tablet   Oral   Take 3.75 mg by mouth 2 (two) times daily as needed. For anxiety         . finasteride (PROSCAR) 5 MG tablet   Oral   Take 5 mg by mouth daily.         Marland Kitchen HYDROcodone-acetaminophen (VICODIN) 5-500 MG per tablet   Oral   Take 1 tablet by mouth every 6 (six) hours as needed for pain.         . metoprolol succinate (TOPROL-XL) 50 MG 24 hr tablet   Oral   Take 50 mg by mouth 2 (two) times daily.         . Multiple Vitamin (MULTIVITAMIN WITH MINERALS) TABS   Oral   Take 1 tablet by mouth daily.         Marland Kitchen omeprazole (PRILOSEC) 20 MG capsule   Oral   Take 20 mg by mouth daily as needed. For acid reflux         . Tafluprost (ZIOPTAN) 0.0015 % SOLN   Both Eyes   Place 1 drop into both eyes daily.         Marland Kitchen  Travoprost, BAK Free, (TRAVATAN) 0.004 % SOLN ophthalmic solution   Both Eyes   Place 1 drop into both eyes at bedtime.           BP 121/77  Pulse 88  Temp(Src) 98.6 F (37 C) (Oral)  Resp 18  Wt 215 lb (97.523 kg)  SpO2 99%  Physical Exam  Nursing note and vitals reviewed. Constitutional: He is oriented to person, place, and time. He appears well-developed and well-nourished.  Uncomfortable appearing male, slightly diaphoretic  HENT:  Head: Normocephalic and atraumatic.  Eyes: Conjunctivae and EOM are normal.  Cardiovascular: Normal rate and regular rhythm.   Pulmonary/Chest: Effort normal. No stridor. No respiratory distress.  Abdominal: He exhibits no distension. There is no hepatosplenomegaly. There is tenderness in the suprapubic area. There is guarding. There is no rigidity and no rebound.  Musculoskeletal: He exhibits no edema.  Neurological: He is alert and oriented to person, place, and time.  Skin: Skin is warm. He is diaphoretic.  Psychiatric: He  has a normal mood and affect.    ED Course  BLADDER CATHETERIZATION Date/Time: 02/09/2013 10:00 AM Performed by: Gerhard Munch Authorized by: Gerhard Munch Consent: Verbal consent obtained. The procedure was performed in an emergent situation. Risks and benefits: risks, benefits and alternatives were discussed Consent given by: patient Patient understanding: patient states understanding of the procedure being performed Patient consent: the patient's understanding of the procedure matches consent given Procedure consent: procedure consent matches procedure scheduled Relevant documents: relevant documents present and verified Test results: test results available and properly labeled Site marked: the operative site was marked Imaging studies: imaging studies available Required items: required blood products, implants, devices, and special equipment available Patient identity confirmed: verbally with patient Time out: Immediately prior to procedure a "time out" was called to verify the correct patient, procedure, equipment, support staff and site/side marked as required. Indications: urinary retention Local anesthesia used: no Patient sedated: no Preparation: Patient was prepped and draped in the usual sterile fashion. Catheter insertion: temporary indwelling Catheter size: 16 Fr Complicated insertion: no Altered anatomy: no Bladder irrigation: no Number of attempts: 1 Urine volume: 500 ml Urine characteristics: clear Patient tolerance: Patient tolerated the procedure well with no immediate complications.   (including critical care time)  Labs Reviewed  URINALYSIS, ROUTINE W REFLEX MICROSCOPIC   No results found.   No diagnosis found.  Immediately after my initial evaluation, bedside ultrasound demonstrates the presence of greater than 600 mL and the patient's bladder. Foley catheter will be placed.  MDM  Patient presents for second time in 2 weeks with urinary  retention.  On exam he is awake alert, appropriate interactive, though in discomfort with abdominal pain.  This improved substantially after placement of a Foley catheter.  Absent fever, evidence of infection on urinalysis, the patient was discharged in stable condition after discussion of the need to follow up with his urologist promptly for additional consideration of his recurrent issue.        Gerhard Munch, MD 02/09/13 1058

## 2013-02-09 NOTE — ED Notes (Addendum)
Pt c/o urinary retention since this morning around 3a.  Reports only being able to dribble.  Pt c/o abd discomfort.  Pt has hx of enlarged prostate and prostate cancer.

## 2013-03-22 ENCOUNTER — Other Ambulatory Visit: Payer: Self-pay | Admitting: Urology

## 2013-04-05 ENCOUNTER — Encounter (HOSPITAL_COMMUNITY): Payer: Self-pay | Admitting: Pharmacy Technician

## 2013-04-10 ENCOUNTER — Encounter (HOSPITAL_COMMUNITY): Payer: Self-pay

## 2013-04-10 ENCOUNTER — Encounter (HOSPITAL_COMMUNITY)
Admission: RE | Admit: 2013-04-10 | Discharge: 2013-04-10 | Disposition: A | Discharge status: Home / Self Care | Payer: BC Managed Care – PPO | Source: Ambulatory Visit | Attending: Urology | Admitting: Urology

## 2013-04-10 ENCOUNTER — Observation Stay (HOSPITAL_COMMUNITY)
Admission: RE | Admit: 2013-04-10 | Discharge: 2013-04-14 | Disposition: A | Discharge status: Home / Self Care | Payer: BC Managed Care – PPO | Source: Ambulatory Visit | Attending: Urology | Admitting: Urology

## 2013-04-10 DIAGNOSIS — K219 Gastro-esophageal reflux disease without esophagitis: Secondary | ICD-10-CM | POA: Insufficient documentation

## 2013-04-10 DIAGNOSIS — R1909 Other intra-abdominal and pelvic swelling, mass and lump: Secondary | ICD-10-CM | POA: Insufficient documentation

## 2013-04-10 DIAGNOSIS — Z79899 Other long term (current) drug therapy: Secondary | ICD-10-CM | POA: Insufficient documentation

## 2013-04-10 DIAGNOSIS — N509 Disorder of male genital organs, unspecified: Secondary | ICD-10-CM | POA: Insufficient documentation

## 2013-04-10 DIAGNOSIS — I10 Essential (primary) hypertension: Secondary | ICD-10-CM | POA: Insufficient documentation

## 2013-04-10 DIAGNOSIS — J45909 Unspecified asthma, uncomplicated: Secondary | ICD-10-CM | POA: Insufficient documentation

## 2013-04-10 DIAGNOSIS — C61 Malignant neoplasm of prostate: Principal | ICD-10-CM | POA: Insufficient documentation

## 2013-04-10 HISTORY — DX: Headache: R51

## 2013-04-10 HISTORY — DX: Anxiety disorder, unspecified: F41.9

## 2013-04-10 HISTORY — DX: Major depressive disorder, single episode, unspecified: F32.9

## 2013-04-10 HISTORY — DX: Gastro-esophageal reflux disease without esophagitis: K21.9

## 2013-04-10 HISTORY — DX: Nausea with vomiting, unspecified: R11.2

## 2013-04-10 HISTORY — DX: Depression, unspecified: F32.A

## 2013-04-10 HISTORY — DX: Cardiac murmur, unspecified: R01.1

## 2013-04-10 HISTORY — DX: Pain, unspecified: R52

## 2013-04-10 HISTORY — DX: Other specified postprocedural states: Z98.890

## 2013-04-10 LAB — ABO/RH: ABO/RH(D): A POS

## 2013-04-10 LAB — BASIC METABOLIC PANEL
BUN: 16 mg/dL (ref 6–23)
CO2: 32 mEq/L (ref 19–32)
Calcium: 9.3 mg/dL (ref 8.4–10.5)
Chloride: 102 mEq/L (ref 96–112)
Creatinine, Ser: 0.91 mg/dL (ref 0.50–1.35)
GFR calc Af Amer: >90 mL/min (ref >90)
GFR calc non Af Amer: >90 mL/min (ref >90)
Glucose, Bld: 92 mg/dL (ref 70–99)
Potassium: 3.8 mEq/L (ref 3.5–5.1)
Sodium: 138 mEq/L (ref 135–145)

## 2013-04-10 LAB — CBC
HCT: 39.3 % (ref 39.0–52.0)
Hemoglobin: 13.4 g/dL (ref 13.0–17.0)
MCH: 30.8 pg (ref 26.0–34.0)
MCHC: 34.1 g/dL (ref 30.0–36.0)
MCV: 90.3 fL (ref 78.0–100.0)
Platelets: 131 10*3/uL — ABNORMAL LOW (ref 150–400)
RBC: 4.35 MIL/uL (ref 4.22–5.81)
RDW: 12.1 % (ref 11.5–15.5)
WBC: 4.3 10*3/uL (ref 4.0–10.5)

## 2013-04-10 MED ORDER — FLEET ENEMA 7-19 GM/118ML RE ENEM: 1.0000 | ENEMA | Freq: Once | RECTAL | Status: DC

## 2013-04-10 MED ORDER — MAGNESIUM CITRATE PO SOLN: 1.0000 | Freq: Once | ORAL | Status: DC

## 2013-04-10 NOTE — Pre-Procedure Instructions (Signed)
EKG AND CXR REPORTS ARE IN EPIC - DONE 06/19/12

## 2013-04-10 NOTE — Patient Instructions (Addendum)
YOUR SURGERY IS SCHEDULED AT [facility]  ON:  Thursday 7/24  REPORT TO [facility] AT:  8:30 AM      PHONE # FOR SHORT STAY IS [phone]              FOLLOW BOWEL PREP INSTRUCTIONS FROM DR. BORDEN OFFICE - DAY BEFORE YOUR SURGERY.  DO NOT EAT OR DRINK ANYTHING AFTER MIDNIGHT THE NIGHT BEFORE YOUR SURGERY.  YOU MAY BRUSH YOUR TEETH, RINSE OUT YOUR MOUTH--BUT NO WATER, NO FOOD, NO CHEWING GUM, NO MINTS, NO CANDIES, NO CHEWING TOBACCO.  PLEASE TAKE THE FOLLOWING MEDICATIONS THE AM OF YOUR SURGERY WITH A FEW SIPS OF WATER:  METOPROLOL, CLORAZEPATE    DO NOT BRING VALUABLES, MONEY, CREDIT CARDS.  DO NOT WEAR JEWELRY, MAKE-UP, NAIL POLISH AND NO METAL PINS OR CLIPS IN YOUR HAIR. CONTACT LENS, DENTURES / PARTIALS, GLASSES SHOULD NOT BE WORN TO SURGERY AND IN MOST CASES-HEARING AIDS WILL NEED TO BE REMOVED.  BRING YOUR GLASSES CASE, ANY EQUIPMENT NEEDED FOR YOUR CONTACT LENS. FOR PATIENTS ADMITTED TO THE HOSPITAL--CHECK OUT TIME THE DAY OF DISCHARGE IS 11:00 AM.  ALL INPATIENT ROOMS ARE PRIVATE - WITH BATHROOM, TELEPHONE, TELEVISION AND WIFI INTERNET.                              PLEASE READ OVER ANY  FACT SHEETS THAT YOU WERE GIVEN: MRSA INFORMATION, BLOOD TRANSFUSION INFORMATION, INCENTIVE SPIROMETER INFORMATION. FAILURE TO FOLLOW THESE INSTRUCTIONS MAY RESULT IN THE CANCELLATION OF YOUR SURGERY.   PATIENT SIGNATURE_________________________________

## 2013-04-12 NOTE — H&P (Signed)
Chief Complaint  Prostate Cancer   Reason For Visit  Reason for consult: To discuss surgical treatment for prostate cancer and specifically to consider a robotic prostatectomy. Physician requesting consult: Dr. Bjorn Pippin PCP: Dr. Adela Lank   History of Present Illness     Tommy [name] is a 62 year old patient who was seen by Dr. Annabell Howells for gross hematuria in March 2014.  He underwent a full urologic evaluation including upper tract imaging and cystoscopy and was found to have bleeding from his enlarged prostate.  His PSA at that time was elevated at 8.83 and he was noted to have a small nodule at the left mid medial gland. He began finasteride at that time. He then underwent a prostate biopsy on 01/26/13 for evaluation of his elevated PSA and he was found to have Gleason 3+3=6 adenocarcinoma of the prostate with 3 out of 12 biopsy cores positive for malignancy. He developed urinary retention and gross hematuria after his biopsy and required a catheter.  He was able to pass a voiding trial and was placed on tamsulosin. He is very well informed about his treatment options and is most interested in proceeding with surgical treatment.  He does have a family history of prostate cancer with his brother having been diagnosed and treated with radiation therapy.  He also has an uncle with a history of prostate cancer.  TNM stage: cT2a Nx Mx PSA: 8.83 Gleason score: 3+3=6 Biopsy (01/26/13): 3/12 cores positive -- L mid (5%), R apex (10%), R lateral base (20%) Prostate volume: 138 cc PSAD: 0.06  Urinary function: He developed retention after his biopsy. His baseline IPSS was 22.  He has been treated with both finasteride and tamsulosin. Erectile function: His baseline SHIM score is 14 although he stated that his function was better prior to starting finasteride. He can achieve better erections if he stops finasteride for 2-3 days although admittedly has not been very sexually active recently.   Past Medical  History Problems  1. History of  Anxiety (Symptom) 300.00 2. History of  Asthma 493.90 3. History of  Depression 311 4. History of  Esophageal Reflux 530.81 5. History of  Glaucoma 365.9 6. History of  Hypertension 401.9 7. History of  Palpitations 785.1   He describes a history of palpitations although has never had a serious arrhythmia which has required follow-up her ongoing evaluation by cardiology.  He has been treated chronically with beta blocker medication since 2001.   Surgical History Problems  1. History of  Arm Incision 2. History of  Excision Of Leg Radical Resection Of Soft Tissue Tumor 3. History of  Inguinal Hernia Repair 4. History of  Inguinal Hernia Repair   He has undergone bilateral open inguinal hernia repairs.   Current Meds 1. Alphagan P 0.1 % Ophthalmic Solution; Therapy: 14Apr2014 to 2. Finasteride 5 MG Oral Tablet; TAKE 1 TABLET DAILY AS DIRECTED; Therapy: 27Mar2014 to  (Evaluate:22Mar2015)  Requested for: 27Mar2014; Last Rx:27Mar2014 3. Latanoprost 0.005 % Ophthalmic Solution; Therapy: 14Apr2014 to 4. Tamsulosin HCl 0.4 MG Oral Capsule; TAKE 1 CAPSULE Bedtime; Therapy: 12May2014 to  (Evaluate:09Sep2014)  Requested for: 12May2014; Last Rx:12May2014 5. Toprol XL 50 MG Oral Tablet Extended Release 24 Hour; Therapy: (Recorded:05Mar2014) to 6. Tranxene-T TABS; Therapy: (Recorded:12May2014) to 7. Travatan Z 0.004 % Ophthalmic Solution; Therapy: (Recorded:05Mar2014) to 8. Tylenol TABS; Therapy: (Recorded:05Mar2014) to  Allergies Medication  1. Paxil TABS 2. Aspirin TABS 3. NSAIDs   To clarify, he has issues sleepwalking when taking aspirin or  nonsteroidal anti-inflammatory drugs but has not had any true allergic reaction.   Family History Problems  1. Family history of  Death In The Family Mother died age 79 pneumonia 2. Maternal history of  Heart Disease V17.49 3. Paternal history of  Hypertension V17.49 4. Fraternal history of  Prostate Cancer  V16.42 5. Maternal uncle's history of  Prostate Cancer V16.42 6. Fraternal history of  Testicular Cancer V16.43 7. Maternal history of  Thyroid Disorder V18.19 removed thyroid 8. 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    Vitals Vital Signs [Data Includes: Last 1 Day]  01Jul2014 08:09AM  BMI Calculated: 22.98 BSA Calculated: 2.39 Height: 6 ft 9 in Weight: 215 lb  Blood Pressure: 118 / 78 Heart Rate: 62  Physical Exam Constitutional: Well nourished and well developed . No acute distress.  ENT:. The ears and nose are normal in appearance.  Neck: The appearance of the neck is normal and no neck mass is present.  Pulmonary: No respiratory distress, normal respiratory rhythm and effort and clear bilateral breath sounds.  Cardiovascular: Heart rate and rhythm are normal . No peripheral edema.  Abdomen: The abdomen is soft and nontender. No masses are palpated. No CVA tenderness. No hernias are palpable. No hepatosplenomegaly noted.  Rectal: Rectal exam demonstrates normal sphincter tone, no tenderness and no masses. Prostate size is estimated to be 80 g. He does have a small nodule noted toward the left mid medial gland. This measures approximately 5 mm to 1 cm. The prostate is not tender. The left seminal vesicle is nonpalpable. The right seminal vesicle is nonpalpable. The perineum is normal on inspection.  Genitourinary: Examination of the penis demonstrates no discharge, no masses, no lesions and a normal meatus. The scrotum is without lesions. The right  epididymis is palpably normal and non-tender. The left epididymis is palpably normal and non-tender. The right testis is non-tender and without masses. The left testis is non-tender and without masses.  Lymphatics: The femoral and inguinal nodes are not enlarged or tender.  Skin: Normal skin turgor, no visible rash and no visible skin lesions.  Neuro/Psych:. Mood and affect are appropriate.    Results/Data  I have independently reviewed his medical records, his PSA results, and pathology report.  Findings are as dictated above.     Assessment Assessed  1. Prostate Cancer 185  Plan Prostate Cancer (185)  1. Follow-up Schedule Surgery Office  Follow-up  Done: 01Jul2014 2. PT/OT Referral Referral  Referral  Requested for: 08Jul2014  Discussion/Summary  1.  Prostate cancer: Mr. Dec has low risk clinically localized prostate cancer with a history of a very enlarged prostate and complications related to his enlarged prostate including lower urinary tract symptoms, a history of urinary retention, and a history of gross hematuria related to his prostate.   The patient was counseled about the natural history of prostate cancer and the standard treatment options that are available for prostate cancer. It was explained to him how his age and life expectancy, clinical stage, Gleason score, and PSA affect his prognosis, the decision to proceed with additional staging studies, as well as how that information influences  recommended treatment strategies. We discussed the roles for active surveillance, radiation therapy, surgical therapy, androgen deprivation, as well as ablative therapy options for the treatment of prostate cancer as appropriate to his individual cancer situation. We discussed the risks and benefits of these options with regard to their impact on cancer control and also in terms of potential adverse events, complications, and impact on quiality of life particularly related to urinary, bowel,  and sexual function. The patient was encouraged to ask questions throughout the discussion today and all questions were answered to his stated satisfaction. In addition, the patient was provided with and/or directed to appropriate resources and literature for further education about prostate cancer and treatment options.   We discussed surgical therapy for prostate cancer including the different available surgical approaches. We discussed, in detail, the risks and expectations of surgery with regard to cancer control, urinary control, and erectile function as well as the expected postoperative recovery process. Additional risks of surgery including but not limited to bleeding, infection, hernia formation, nerve damage, lymphocele formation, bowel/rectal injury potentially necessitating colostomy, damage to the urinary tract resulting in urine leakage, urethral stricture, and the cardiopulmonary risks such as myocardial infarction, stroke, death, venothromboembolism, etc. were explained. The risk of open surgical conversion for robotic/laparoscopic prostatectomy was also discussed.   We reviewed options for management/treatment.  Considering his low risk disease and very enlarged prostate, I recommended that he consider either active surveillance or treatment of curative intent with surgical therapy but cautioned him against radiation therapy considering the potential risks associated with his enlarged prostate.  He is very well informed about his options and would like to proceed with surgical treatment.  He will be scheduled for a bilateral nerve sparing robotic-assisted laparoscopic radical prostatectomy.  He has appropriate expectations with regard to urinary control and erectile function postoperatively.  Cc: Dr. Bjorn Pippin Dr. Adela Lank    SignaturesElectronically signed by : Heloise Purpura, M.D.; Mar 21 2013 12:00PM

## 2013-04-13 ENCOUNTER — Encounter (HOSPITAL_COMMUNITY): Payer: Self-pay | Admitting: *Deleted

## 2013-04-13 ENCOUNTER — Encounter (HOSPITAL_COMMUNITY): Payer: Self-pay | Admitting: Anesthesiology

## 2013-04-13 ENCOUNTER — Encounter (HOSPITAL_COMMUNITY)
Admission: RE | Disposition: A | Discharge status: Home / Self Care | Payer: Self-pay | Source: Ambulatory Visit | Attending: Urology

## 2013-04-13 ENCOUNTER — Ambulatory Visit (HOSPITAL_COMMUNITY): Payer: BC Managed Care – PPO | Admitting: Anesthesiology

## 2013-04-13 HISTORY — PX: ROBOT ASSISTED LAPAROSCOPIC RADICAL PROSTATECTOMY: SHX5141

## 2013-04-13 LAB — HEMOGLOBIN AND HEMATOCRIT, BLOOD
HCT: 33.2 % — ABNORMAL LOW (ref 39.0–52.0)
Hemoglobin: 11.4 g/dL — ABNORMAL LOW (ref 13.0–17.0)

## 2013-04-13 LAB — TYPE AND SCREEN
ABO/RH(D): A POS
Antibody Screen: NEGATIVE

## 2013-04-13 SURGERY — ROBOTIC ASSISTED LAPAROSCOPIC RADICAL PROSTATECTOMY LEVEL 3
Anesthesia: General | Wound class: Clean Contaminated

## 2013-04-13 MED ORDER — SODIUM CHLORIDE 0.9 % IR SOLN
Status: DC | PRN
  Administered 2013-04-13: 1000 mL

## 2013-04-13 MED ORDER — BRIMONIDINE TARTRATE 0.15 % OP SOLN
1.0000 [drp] | Freq: Three times a day (TID) | OPHTHALMIC | Status: DC
  Filled 2013-04-13: qty 5

## 2013-04-13 MED ORDER — HYDROCODONE-ACETAMINOPHEN 5-500 MG PO TABS: 1.0000 | ORAL_TABLET | Freq: Four times a day (QID) | ORAL | Status: DC | PRN

## 2013-04-13 MED ORDER — METOPROLOL SUCCINATE ER 25 MG PO TB24
25.0000 mg | ORAL_TABLET | Freq: Every day | ORAL | Status: DC
  Administered 2013-04-13: 25 mg via ORAL
  Filled 2013-04-13 (×2): qty 1

## 2013-04-13 MED ORDER — LACTATED RINGERS IV SOLN
INTRAVENOUS | Status: DC | PRN
  Administered 2013-04-13 (×2): via INTRAVENOUS

## 2013-04-13 MED ORDER — DIPHENHYDRAMINE HCL 12.5 MG/5ML PO ELIX
12.5000 mg | ORAL_SOLUTION | Freq: Four times a day (QID) | ORAL | Status: DC | PRN
  Filled 2013-04-13: qty 10

## 2013-04-13 MED ORDER — DOCUSATE SODIUM 100 MG PO CAPS
100.0000 mg | ORAL_CAPSULE | Freq: Two times a day (BID) | ORAL | Status: DC
  Administered 2013-04-14: 100 mg via ORAL
  Filled 2013-04-13 (×3): qty 1

## 2013-04-13 MED ORDER — ACETAMINOPHEN 325 MG PO TABS
650.0000 mg | ORAL_TABLET | ORAL | Status: DC | PRN
  Administered 2013-04-13 – 2013-04-14 (×3): 650 mg via ORAL
  Filled 2013-04-13 (×3): qty 2

## 2013-04-13 MED ORDER — CISATRACURIUM BESYLATE (PF) 10 MG/5ML IV SOLN
INTRAVENOUS | Status: DC | PRN
  Administered 2013-04-13: 4 mg via INTRAVENOUS
  Administered 2013-04-13: 2 mg via INTRAVENOUS
  Administered 2013-04-13: 1 mg via INTRAVENOUS
  Administered 2013-04-13: 10 mg via INTRAVENOUS
  Administered 2013-04-13 (×2): 1 mg via INTRAVENOUS

## 2013-04-13 MED ORDER — EPHEDRINE SULFATE 50 MG/ML IJ SOLN
INTRAMUSCULAR | Status: DC | PRN
  Administered 2013-04-13 (×3): 10 mg via INTRAVENOUS

## 2013-04-13 MED ORDER — LACTATED RINGERS IV SOLN: INTRAVENOUS | Status: DC

## 2013-04-13 MED ORDER — DEXAMETHASONE SODIUM PHOSPHATE 4 MG/ML IJ SOLN
INTRAMUSCULAR | Status: DC | PRN
  Administered 2013-04-13: 10 mg via INTRAVENOUS

## 2013-04-13 MED ORDER — LIDOCAINE HCL (CARDIAC) 20 MG/ML IV SOLN
INTRAVENOUS | Status: DC | PRN
  Administered 2013-04-13: 30 mg via INTRAVENOUS

## 2013-04-13 MED ORDER — KETOROLAC TROMETHAMINE 15 MG/ML IJ SOLN
15.0000 mg | Freq: Four times a day (QID) | INTRAMUSCULAR | Status: DC
  Administered 2013-04-14: 15 mg via INTRAVENOUS
  Filled 2013-04-13 (×6): qty 1

## 2013-04-13 MED ORDER — CEFAZOLIN SODIUM 1-5 GM-% IV SOLN
1.0000 g | Freq: Three times a day (TID) | INTRAVENOUS | Status: AC
  Administered 2013-04-13 – 2013-04-14 (×2): 1 g via INTRAVENOUS
  Filled 2013-04-13 (×2): qty 50

## 2013-04-13 MED ORDER — CIPROFLOXACIN HCL 500 MG PO TABS: 500.0000 mg | ORAL_TABLET | Freq: Two times a day (BID) | ORAL | Status: DC

## 2013-04-13 MED ORDER — HYDROMORPHONE HCL PF 1 MG/ML IJ SOLN: 0.2500 mg | INTRAMUSCULAR | Status: DC | PRN

## 2013-04-13 MED ORDER — KCL IN DEXTROSE-NACL 20-5-0.45 MEQ/L-%-% IV SOLN
INTRAVENOUS | Status: DC
  Administered 2013-04-13 – 2013-04-14 (×2): via INTRAVENOUS
  Filled 2013-04-13 (×4): qty 1000

## 2013-04-13 MED ORDER — PROMETHAZINE HCL 25 MG/ML IJ SOLN: 6.2500 mg | INTRAMUSCULAR | Status: DC | PRN

## 2013-04-13 MED ORDER — CLORAZEPATE DIPOTASSIUM 3.75 MG PO TABS
3.7500 mg | ORAL_TABLET | Freq: Two times a day (BID) | ORAL | Status: DC | PRN
  Administered 2013-04-14: 3.75 mg via ORAL
  Filled 2013-04-13: qty 1

## 2013-04-13 MED ORDER — FENTANYL CITRATE 0.05 MG/ML IJ SOLN
INTRAMUSCULAR | Status: DC | PRN
  Administered 2013-04-13: 50 ug via INTRAVENOUS
  Administered 2013-04-13: 25 ug via INTRAVENOUS
  Administered 2013-04-13 (×2): 75 ug via INTRAVENOUS
  Administered 2013-04-13: 100 ug via INTRAVENOUS
  Administered 2013-04-13: 50 ug via INTRAVENOUS
  Administered 2013-04-13: 25 ug via INTRAVENOUS

## 2013-04-13 MED ORDER — LACTATED RINGERS IV SOLN
INTRAVENOUS | Status: DC
  Administered 2013-04-13: 1000 mL via INTRAVENOUS

## 2013-04-13 MED ORDER — NEOSTIGMINE METHYLSULFATE 1 MG/ML IJ SOLN
INTRAMUSCULAR | Status: DC | PRN
  Administered 2013-04-13: 3 mg via INTRAVENOUS

## 2013-04-13 MED ORDER — INDIGOTINDISULFONATE SODIUM 8 MG/ML IJ SOLN
INTRAMUSCULAR | Status: DC | PRN
  Administered 2013-04-13: 5 mL via INTRAVENOUS

## 2013-04-13 MED ORDER — LACTATED RINGERS IV SOLN
INTRAVENOUS | Status: DC | PRN
  Administered 2013-04-13: 12:00:00

## 2013-04-13 MED ORDER — CEFAZOLIN SODIUM-DEXTROSE 2-3 GM-% IV SOLR
2.0000 g | INTRAVENOUS | Status: AC
  Administered 2013-04-13: 2 g via INTRAVENOUS

## 2013-04-13 MED ORDER — KETOROLAC TROMETHAMINE 30 MG/ML IJ SOLN
30.0000 mg | Freq: Once | INTRAMUSCULAR | Status: AC
  Administered 2013-04-13: 30 mg via INTRAVENOUS

## 2013-04-13 MED ORDER — SUCCINYLCHOLINE CHLORIDE 20 MG/ML IJ SOLN
INTRAMUSCULAR | Status: DC | PRN
  Administered 2013-04-13: 150 mg via INTRAVENOUS

## 2013-04-13 MED ORDER — STERILE WATER FOR IRRIGATION IR SOLN
Status: DC | PRN
  Administered 2013-04-13: 3000 mL

## 2013-04-13 MED ORDER — DIPHENHYDRAMINE HCL 50 MG/ML IJ SOLN: 12.5000 mg | Freq: Four times a day (QID) | INTRAMUSCULAR | Status: DC | PRN

## 2013-04-13 MED ORDER — BUPIVACAINE-EPINEPHRINE 0.25% -1:200000 IJ SOLN
INTRAMUSCULAR | Status: DC | PRN
  Administered 2013-04-13: 30 mL

## 2013-04-13 MED ORDER — METOPROLOL SUCCINATE ER 50 MG PO TB24
50.0000 mg | ORAL_TABLET | Freq: Every day | ORAL | Status: DC
  Filled 2013-04-13: qty 1

## 2013-04-13 MED ORDER — KETAMINE HCL 10 MG/ML IJ SOLN
INTRAMUSCULAR | Status: DC | PRN
  Administered 2013-04-13: 20 mg via INTRAVENOUS

## 2013-04-13 MED ORDER — SODIUM CHLORIDE 0.9 % IV BOLUS (SEPSIS): 1000.0000 mL | Freq: Once | INTRAVENOUS | Status: DC

## 2013-04-13 MED ORDER — PROPOFOL 10 MG/ML IV BOLUS
INTRAVENOUS | Status: DC | PRN
  Administered 2013-04-13: 200 mg via INTRAVENOUS

## 2013-04-13 MED ORDER — GLYCOPYRROLATE 0.2 MG/ML IJ SOLN
INTRAMUSCULAR | Status: DC | PRN
  Administered 2013-04-13: 0.4 mg via INTRAVENOUS
  Administered 2013-04-13: .2 mg via INTRAVENOUS
  Administered 2013-04-13: 0.4 mg via INTRAVENOUS

## 2013-04-13 MED ORDER — MORPHINE SULFATE 10 MG/ML IJ SOLN: 2.0000 mg | INTRAMUSCULAR | Status: DC | PRN

## 2013-04-13 MED ORDER — ACETAMINOPHEN 10 MG/ML IV SOLN
1000.0000 mg | Freq: Four times a day (QID) | INTRAVENOUS | Status: DC
  Administered 2013-04-13: 1000 mg via INTRAVENOUS
  Filled 2013-04-13 (×4): qty 100

## 2013-04-13 MED ORDER — HYDROCODONE-ACETAMINOPHEN 5-325 MG PO TABS: 1.0000 | ORAL_TABLET | Freq: Four times a day (QID) | ORAL | Status: DC | PRN

## 2013-04-13 MED ORDER — ACETAMINOPHEN 500 MG PO TABS
1000.0000 mg | ORAL_TABLET | Freq: Four times a day (QID) | ORAL | Status: AC
  Filled 2013-04-13 (×2): qty 2

## 2013-04-13 MED ORDER — HYDROMORPHONE HCL PF 1 MG/ML IJ SOLN
INTRAMUSCULAR | Status: DC | PRN
  Administered 2013-04-13 (×4): 0.5 mg via INTRAVENOUS

## 2013-04-13 MED ORDER — LATANOPROST 0.005 % OP SOLN
1.0000 [drp] | Freq: Every day | OPHTHALMIC | Status: DC
  Administered 2013-04-13: 1 [drp] via OPHTHALMIC
  Filled 2013-04-13: qty 2.5

## 2013-04-13 MED ORDER — MIDAZOLAM HCL 2 MG/2ML IJ SOLN
1.0000 mg | INTRAMUSCULAR | Status: DC | PRN
  Administered 2013-04-13: 1 mg via INTRAVENOUS

## 2013-04-13 SURGICAL SUPPLY — 44 items
CANISTER SUCTION 2500CC (MISCELLANEOUS) ×2 IMPLANT
CATH FOLEY 2WAY SLVR 18FR 30CC (CATHETERS) ×2 IMPLANT
CATH ROBINSON RED A/P 16FR (CATHETERS) ×2 IMPLANT
CATH ROBINSON RED A/P 8FR (CATHETERS) ×2 IMPLANT
CATH TIEMANN FOLEY 18FR 5CC (CATHETERS) ×2 IMPLANT
CHLORAPREP W/TINT 26ML (MISCELLANEOUS) ×2 IMPLANT
CLIP LIGATING HEM O LOK PURPLE (MISCELLANEOUS) ×4 IMPLANT
CLOTH BEACON ORANGE TIMEOUT ST (SAFETY) ×2 IMPLANT
CORD HIGH FREQUENCY UNIPOLAR (ELECTROSURGICAL) ×2 IMPLANT
COVER SURGICAL LIGHT HANDLE (MISCELLANEOUS) ×2 IMPLANT
COVER TIP SHEARS 8 DVNC (MISCELLANEOUS) ×1 IMPLANT
COVER TIP SHEARS 8MM DA VINCI (MISCELLANEOUS) ×1
CUTTER ECHEON FLEX ENDO 45 340 (ENDOMECHANICALS) ×2 IMPLANT
DECANTER SPIKE VIAL GLASS SM (MISCELLANEOUS) ×1 IMPLANT
DRAPE SURG IRRIG POUCH 19X23 (DRAPES) ×2 IMPLANT
DRSG TEGADERM 2-3/8X2-3/4 SM (GAUZE/BANDAGES/DRESSINGS) ×8 IMPLANT
DRSG TEGADERM 4X4.75 (GAUZE/BANDAGES/DRESSINGS) ×4 IMPLANT
DRSG TEGADERM 6X8 (GAUZE/BANDAGES/DRESSINGS) ×4 IMPLANT
ELECT REM PT RETURN 9FT ADLT (ELECTROSURGICAL) ×2
ELECTRODE REM PT RTRN 9FT ADLT (ELECTROSURGICAL) ×1 IMPLANT
GAUZE SPONGE 2X2 8PLY STRL LF (GAUZE/BANDAGES/DRESSINGS) IMPLANT
GLOVE BIO SURGEON STRL SZ 6.5 (GLOVE) ×2 IMPLANT
GLOVE BIOGEL M STRL SZ7.5 (GLOVE) ×4 IMPLANT
GOWN STRL NON-REIN LRG LVL3 (GOWN DISPOSABLE) ×8 IMPLANT
GOWN STRL REIN XL XLG (GOWN DISPOSABLE) ×2 IMPLANT
HEMOSTAT SURGICEL 2X3 (HEMOSTASIS) ×1 IMPLANT
HOLDER FOLEY CATH W/STRAP (MISCELLANEOUS) ×2 IMPLANT
IV LACTATED RINGERS 1000ML (IV SOLUTION) IMPLANT
KIT ACCESSORY DA VINCI DISP (KITS) ×1
KIT ACCESSORY DVNC DISP (KITS) ×1 IMPLANT
NDL SAFETY ECLIPSE 18X1.5 (NEEDLE) ×1 IMPLANT
NEEDLE HYPO 18GX1.5 SHARP (NEEDLE) ×2
PACK ROBOT UROLOGY CUSTOM (CUSTOM PROCEDURE TRAY) ×2 IMPLANT
RELOAD GREEN ECHELON 45 (STAPLE) ×2 IMPLANT
SET TUBE IRRIG SUCTION NO TIP (IRRIGATION / IRRIGATOR) ×2 IMPLANT
SOLUTION ELECTROLUBE (MISCELLANEOUS) ×2 IMPLANT
SPONGE GAUZE 2X2 STER 10/PKG (GAUZE/BANDAGES/DRESSINGS)
SUT ETHILON 3 0 PS 1 (SUTURE) ×2 IMPLANT
SUT VIC AB 3-0 SH 27 (SUTURE) ×4
SUT VIC AB 3-0 SH 27X BRD (SUTURE) IMPLANT
SUT VICRYL 0 UR6 27IN ABS (SUTURE) ×4 IMPLANT
SYR 27GX1/2 1ML LL SAFETY (SYRINGE) ×2 IMPLANT
TOWEL OR NON WOVEN STRL DISP B (DISPOSABLE) ×2 IMPLANT
WATER STERILE IRR 1500ML POUR (IV SOLUTION) IMPLANT

## 2013-04-13 NOTE — Progress Notes (Signed)
PHARMACY BRIEF NOTE:  SCHEDULED IV ACETAMINOPHEN:  CONVERSION TO ORAL ROUTE to complete the ordered doses.  The Pharmacy and Therapeutics Committee has restricted administration of IV acetaminophen (with a 24 hr maximum duration) to patients who meet both of the following criteria:  Unable to tolerate oral or enteral medication  Contraindication to NSAIDs  Because the patient has taken oral medications postoperatively today, IV acetaminophen has been converted to PO to complete the course of therapy originally ordered.  If PO acetaminophen should be continued beyond the original stop time, please adjust the order accordingly using the "modify" function.   If you have questions about this conversion, please contact the pharmacy department.  Elie Goody, PharmD, BCPS Pager: [phone] 04/13/2013  6:58 PM

## 2013-04-13 NOTE — Anesthesia Preprocedure Evaluation (Signed)
Anesthesia Evaluation  Patient identified by MRN, date of birth, ID band Patient awake    Reviewed: Allergy & Precautions, H&P , NPO status , Patient's Chart, lab work & pertinent test results  History of Anesthesia Complications (+) PONV  Airway Mallampati: II TM Distance: >3 FB Neck ROM: Full    Dental  (+) Teeth Intact and Dental Advisory Given   Pulmonary asthma ,  breath sounds clear to auscultation  Pulmonary exam normal       Cardiovascular hypertension, Pt. on medications and Pt. on home beta blockers + dysrhythmias (Hx of palpitations) + Valvular Problems/Murmurs Rhythm:Regular Rate:Normal     Neuro/Psych  Headaches, Anxiety Depression    GI/Hepatic negative GI ROS, Neg liver ROS, GERD-  ,  Endo/Other  negative endocrine ROS  Renal/GU negative Renal ROS  negative genitourinary   Musculoskeletal negative musculoskeletal ROS (+)   Abdominal   Peds negative pediatric ROS (+)  Hematology negative hematology ROS (+)   Anesthesia Other Findings   Reproductive/Obstetrics negative OB ROS                           Anesthesia Physical Anesthesia Plan  ASA: II  Anesthesia Plan: General   Post-op Pain Management:    Induction: Intravenous  Airway Management Planned: Oral ETT  Additional Equipment:   Intra-op Plan:   Post-operative Plan: Extubation in OR  Informed Consent: I have reviewed the patients History and Physical, chart, labs and discussed the procedure including the risks, benefits and alternatives for the proposed anesthesia with the patient or authorized representative who has indicated his/her understanding and acceptance.   Dental advisory given  Plan Discussed with: CRNA  Anesthesia Plan Comments:         Anesthesia Quick Evaluation

## 2013-04-13 NOTE — Transfer of Care (Signed)
Immediate Anesthesia Transfer of Care Note  Patient: Tommy [name]  Procedure(s) Performed: Procedure(s): ROBOTIC ASSISTED LAPAROSCOPIC RADICAL PROSTATECTOMY LEVEL 3 (N/A)  Patient Location: PACU  Anesthesia Type:General  Level of Consciousness: awake, sedated and patient cooperative  Airway & Oxygen Therapy: Patient Spontanous Breathing and Patient connected to nasal cannula oxygen  Post-op Assessment: Report given to PACU RN and Post -op Vital signs reviewed and stable  Post vital signs: stable  Complications: No apparent anesthesia complications

## 2013-04-13 NOTE — Op Note (Signed)
Preoperative diagnosis: Clinically localized adenocarcinoma of the prostate (clinical stage T2a Nx Mx)  Postoperative diagnosis: Clinically localized adenocarcinoma of the prostate (clinical stage T2a Nx Mx)  Procedure:  1. Robotic assisted laparoscopic radical prostatectomy (bilateral nerve sparing)  Surgeon: Rolly Salter, Montez Hageman. M.D.  Assistant: Pecola Leisure, PA-C  Anesthesia: General  Complications: None  EBL: 200 mL  IVF:  2000 mL crystalloid  Specimens: 1. Prostate and seminal vesicles 2. Removal of mass (probable lymph node) in right inguinal region 3. Abdominal skin lesion  Disposition of specimens: Pathology  Drains: 1. 20 Fr coude catheter 2. # 19 Blake pelvic drain  Indication: Tommy [name] is a 62 y.o. year old patient with clinically localized prostate cancer.  After a thorough review of the management options for treatment of prostate cancer, he elected to proceed with surgical therapy and the above procedure(s).  We have discussed the potential benefits and risks of the procedure, side effects of the proposed treatment, the likelihood of the patient achieving the goals of the procedure, and any potential problems that might occur during the procedure or recuperation. Informed consent has been obtained.  Description of procedure:  The patient was taken to the operating room and a general anesthetic was administered. He was given preoperative antibiotics, placed in the dorsal lithotomy position, and prepped and draped in the usual sterile fashion. Next a preoperative timeout was performed. A urethral catheter was placed into the bladder and a site was selected near the umbilicus for placement of the camera port. This was placed using a standard open Hassan technique which allowed entry into the peritoneal cavity under direct vision and without difficulty. A 12 mm port was placed and a pneumoperitoneum established. The camera was then used to inspect the abdomen  and there was no evidence of any intra-abdominal injuries or other abnormalities. The remaining abdominal ports were then placed. 8 mm robotic ports were placed in the right lower quadrant, left lower quadrant, and far left lateral abdominal wall. A 5 mm port was placed in the right upper quadrant and a 12 mm port was placed in the right lateral abdominal wall for laparoscopic assistance. All ports were placed under direct vision without difficulty. The surgical cart was then docked.   Utilizing the cautery scissors, the bladder was reflected posteriorly allowing entry into the space of Retzius and identification of the endopelvic fascia and prostate. During this dissection there was noted to be a 1.5 cm soft, yellowish mass possibly consistent with a lymph node located along the lateral border of the vas deferens.  Considering the unusual location, this was excised and sent to pathology despite his low risk prostate cancer. The periprostatic fat was then removed from the prostate allowing full exposure of the endopelvic fascia. The endopelvic fascia was then incised from the apex back to the base of the prostate bilaterally and the underlying levator muscle fibers were swept laterally off the prostate thereby isolating the dorsal venous complex. The dorsal vein was then stapled and divided with a 45 mm Flex Echelon stapler. Attention then turned to the bladder neck.  The patient was known to have a very large 140 cc prostate.  The anterior bladder neck was identified and was divided anteriorly thereby allowing entry into the bladder and exposure of the urethral catheter. The catheter balloon was deflated and the catheter was brought into the operative field and used to retract the prostate anteriorly. The posterior bladder neck was then examined.  There was very large  median lobe. The dissection required a wide bladder neck excision and the median lobe was lifted anteriorly as the mucosa of the posterior bladder  neck was incised allowing the plane between the bladder and prostate to be developed. This dissection continued until the vasa deferentia and seminal vessels were identified. The vasa deferentia were isolated, divided, and lifted anteriorly. The seminal vesicles were dissected down to their tips with care to control the seminal vascular arterial blood supply. These structures were then lifted anteriorly and the space between Denonvillier's fascia and the anterior rectum was developed with a combination of sharp and blunt dissection. This isolated the vascular pedicles of the prostate.  The lateral prostatic fascia was then sharply incised allowing release of the neurovascular bundles bilaterally. The vascular pedicles of the prostate were then ligated with Weck clips between the prostate and neurovascular bundles and divided with sharp cold scissor dissection resulting in neurovascular bundle preservation. The neurovascular bundles were then separated off the apex of the prostate and urethra bilaterally.  The urethra was then sharply transected allowing the prostate specimen to be disarticulated. The pelvis was copiously irrigated and hemostasis was ensured. There was no evidence for rectal injury.  Attention then turned to the urethral anastomosis. A 2-0 Vicryl slip knot was placed between Denonvillier's fascia, the posterior bladder neck, and the posterior urethra to reapproximate these structures. A double-armed 3-0 Monocryl suture was then used to perform a 360 running tension-free anastomosis between the bladder neck and urethra. A new urethral catheter was then placed into the bladder and irrigated. There were no blood clots within the bladder and the anastomosis appeared to be watertight. A #19 Blake drain was then brought through the left lateral 8 mm port site and positioned appropriately within the pelvis. It was secured to the skin with a nylon suture. The surgical cart was then undocked. The right  lateral 12 mm port site was closed at the fascial level with a 0 Vicryl suture placed laparoscopically. All remaining ports were then removed under direct vision. The prostate specimen was removed intact within the Endopouch retrieval bag via the periumbilical camera port site. Due to the size of the prostate, this required extension of the midline incision.  There was a 1 cm skin lesion consistent with a benign skin nevus in the abdominal midline.  This was excised to allow the incision to be extended superiorly. This fascial opening was closed with two running 0 Vicryl sutures. 0.25% Marcaine was then injected into all port sites and all incisions were reapproximated at the skin level with staples. Sterile dressings were applied. The patient appeared to tolerate the procedure well and without complications. The patient was able to be extubated and transferred to the recovery unit in satisfactory condition.  [name] MD

## 2013-04-13 NOTE — Interval H&P Note (Signed)
History and Physical Interval Note:  04/13/2013 9:54 AM  Tommy [name]  has presented today for surgery, with the diagnosis of Prostate Cancer  The various methods of treatment have been discussed with the patient and family. After consideration of risks, benefits and other options for treatment, the patient has consented to  Procedure(s): ROBOTIC ASSISTED LAPAROSCOPIC RADICAL PROSTATECTOMY LEVEL 3 (N/A) as a surgical intervention .  The patient's history has been reviewed, patient examined, no change in status, stable for surgery.  I have reviewed the patient's chart and labs.  Questions were answered to the patient's satisfaction.     Orlan Aversa,LES

## 2013-04-13 NOTE — Anesthesia Postprocedure Evaluation (Signed)
Anesthesia Post Note  Patient: Tommy [name]  Procedure(s) Performed: Procedure(s) (LRB): ROBOTIC ASSISTED LAPAROSCOPIC RADICAL PROSTATECTOMY LEVEL 3 (N/A)  Anesthesia type: General  Patient location: PACU  Post pain: Pain level controlled  Post assessment: Post-op Vital signs reviewed  Last Vitals: BP 123/71  Pulse 50  Temp(Src) 36.4 C (Oral)  Resp 14  Ht 6\' 9"  (2.057 m)  Wt 215 lb (97.523 kg)  BMI 23.05 kg/m2  SpO2 100%  Post vital signs: Reviewed  Level of consciousness: sedated  Complications: No apparent anesthesia complications

## 2013-04-14 ENCOUNTER — Encounter (HOSPITAL_COMMUNITY): Payer: Self-pay | Admitting: Urology

## 2013-04-14 LAB — HEMOGLOBIN AND HEMATOCRIT, BLOOD
HCT: 32.4 % — ABNORMAL LOW (ref 39.0–52.0)
Hemoglobin: 11.1 g/dL — ABNORMAL LOW (ref 13.0–17.0)

## 2013-04-14 MED ORDER — BELLADONNA ALKALOIDS-OPIUM 16.2-60 MG RE SUPP
1.0000 | Freq: Four times a day (QID) | RECTAL | Status: DC | PRN
  Administered 2013-04-14: 1 via RECTAL
  Filled 2013-04-14: qty 1

## 2013-04-14 MED ORDER — DSS 100 MG PO CAPS: 100.0000 mg | ORAL_CAPSULE | Freq: Two times a day (BID) | ORAL | Status: DC

## 2013-04-14 MED ORDER — OXYBUTYNIN CHLORIDE ER 10 MG PO TB24: 10.0000 mg | ORAL_TABLET | Freq: Every day | ORAL | Status: DC

## 2013-04-14 MED ORDER — BISACODYL 10 MG RE SUPP
10.0000 mg | Freq: Once | RECTAL | Status: AC
  Administered 2013-04-14: 10 mg via RECTAL
  Filled 2013-04-14: qty 1

## 2013-04-14 MED ORDER — HYDROCODONE-ACETAMINOPHEN 5-325 MG PO TABS: 1.0000 | ORAL_TABLET | Freq: Four times a day (QID) | ORAL | Status: DC | PRN

## 2013-04-14 NOTE — Progress Notes (Signed)
Patient ID: Tommy [name], male   DOB: 1951/04/23, 62 y.o.   MRN: [id number]  1 Day Post-Op Subjective: The patient is doing well.  No nausea or vomiting. Pain is adequately controlled.  Objective: Vital signs in last 24 hours: Temp:  [97.2 F (36.2 C)-98 F (36.7 C)] 98 F (36.7 C) (07/25 0545) Pulse Rate:  [49-66] 53 (07/25 0701) Resp:  [9-20] 16 (07/25 0545) BP: (94-130)/(58-86) 100/62 mmHg (07/25 0705) SpO2:  [95 %-100 %] 100 % (07/25 0545) Weight:  [97.523 kg (215 lb)] 97.523 kg (215 lb) (07/24 1536)  Intake/Output from previous day: 07/24 0701 - 07/25 0700 In: 2367.5 [I.V.:2267.5; IV Piggyback:100] Out: 2870 [Urine:2375; Drains:295; Blood:200] Intake/Output this shift:    Physical Exam:  General: Alert and oriented. CV: RRR Lungs: Clear bilaterally. GI: Soft, Nondistended. Incisions: Dressings intact. Urine: Clear Extremities: Nontender, no erythema, no edema.  Lab Results:  Recent Labs  04/13/13 1515 04/14/13 0445  HGB 11.4* 11.1*  HCT 33.2* 32.4*      Assessment/Plan: POD# 1 s/p robotic prostatectomy.  1) SL IVF 2) Ambulate, Incentive spirometry 3) Transition to oral pain medication 4) Dulcolax suppository 5) D/C pelvic drain 6) Plan for likely discharge later today   [name]. MD   LOS: 1 day   [name],LES 04/14/2013, 7:20 AM

## 2013-04-14 NOTE — Progress Notes (Signed)
Upon entering patient's room, patient in bathroom sitting on commode.  Patient grunting and straining to "try and pass gas".  Patient informed not to strain and educated on the impact straining could have after having a robotic                    prostectomy.  Patient stated " I know what to do, I have had catheters before, sitting here always helps me and its like a storm that needs to pass."  Patient refusing to get off commode until his "gas passes."  Will continue to educate patient and monitor.

## 2013-04-14 NOTE — Discharge Summary (Signed)
  Date of admission: 04/13/2013  Date of discharge: 04/14/2013  Admission diagnosis: Prostate Cancer  Discharge diagnosis: Prostate Cancer  History and Physical: For full details, please see admission history and physical. Briefly, Tommy [name] is a 62 y.o. gentleman with localized prostate cancer.  After discussing management/treatment options, he elected to proceed with surgical treatment.  Hospital Course: Tommy [name] was taken to the operating room on 04/13/2013 and underwent a robotic assisted laparoscopic radical prostatectomy. He tolerated this procedure well and without complications. Postoperatively, he was able to be transferred to a regular hospital room following recovery from anesthesia.  He was able to begin ambulating the night of surgery. He remained hemodynamically stable overnight.  He had excellent urine output with appropriately minimal output from his pelvic drain and his pelvic drain was removed on POD #1.  He was transitioned to oral pain medication, tolerated a clear liquid diet, and had met all discharge criteria and was able to be discharged home later on POD#1.  Laboratory values:  Recent Labs  04/13/13 1515 04/14/13 0445  HGB 11.4* 11.1*  HCT 33.2* 32.4*    Disposition: Home  Discharge instruction: He was instructed to be ambulatory but to refrain from heavy lifting, strenuous activity, or driving. He was instructed on urethral catheter care.  Discharge medications:     Medication List    STOP taking these medications       acetaminophen 500 MG tablet  Commonly known as:  TYLENOL     finasteride 5 MG tablet  Commonly known as:  PROSCAR     HYDROcodone-acetaminophen 5-500 MG per tablet  Commonly known as:  VICODIN  Replaced by:  HYDROcodone-acetaminophen 5-325 MG per tablet     tamsulosin 0.4 MG Caps  Commonly known as:  FLOMAX      TAKE these medications       brimonidine 0.1 % Soln  Commonly known as:  ALPHAGAN P  Place 1 drop into both  eyes every 8 (eight) hours.     chlorpheniramine 4 MG tablet  Commonly known as:  CHLOR-TRIMETON  Take 4 mg by mouth 2 (two) times daily as needed for allergies.     ciprofloxacin 500 MG tablet  Commonly known as:  CIPRO  Take 1 tablet (500 mg total) by mouth 2 (two) times daily. Start day prior to office visit for foley removal     clorazepate 3.75 MG tablet  Commonly known as:  TRANXENE  Take 3.75 mg by mouth 2 (two) times daily as needed for anxiety.     DSS 100 MG Caps  Take 100 mg by mouth 2 (two) times daily.     HYDROcodone-acetaminophen 5-325 MG per tablet  Commonly known as:  NORCO  Take 1-2 tablets by mouth every 6 (six) hours as needed for pain.     latanoprost 0.005 % ophthalmic solution  Commonly known as:  XALATAN  Place 1 drop into both eyes at bedtime.     metoprolol succinate 50 MG 24 hr tablet  Commonly known as:  TOPROL-XL  Take by mouth 2 (two) times daily. TAKES 50 MG IN AM AND 25 MG AT BEDTIME     oxybutynin 10 MG 24 hr tablet  Commonly known as:  DITROPAN XL  Take 1 tablet (10 mg total) by mouth daily.        Followup: He will followup in 1 week for catheter removal and to discuss his surgical pathology results.

## 2013-04-15 NOTE — Care Management Note (Signed)
    Page 1 of 1   04/15/2013     7:38:14 AM   CARE MANAGEMENT NOTE 04/15/2013  Patient:  Tommy [name], Tommy [name]   Account Number:  [id number]  Date Initiated:  04/14/2013  Documentation initiated by:  Ezekiel Ina  Subjective/Objective Assessment:   pt admiitted for radial prostatectomy     Action/Plan:   from home   Anticipated DC Date:  04/15/2013   Anticipated DC Plan:  HOME/SELF CARE      DC Planning Services  CM consult      Choice offered to / List presented to:             Status of service:  Completed, signed off Medicare Important Message given?  NA - LOS <3 / Initial given by admissions (If response is "NO", the following Medicare IM given date fields will be blank) Date Medicare IM given:   Date Additional Medicare IM given:    Discharge Disposition:  HOME/SELF CARE  Per UR Regulation:  Reviewed for med. necessity/level of care/duration of stay  If discussed at Long Length of Stay Meetings, dates discussed:    Comments:  04/14/13 MMcGibboney, RN, BSN Chart reviewed.

## 2015-04-02 ENCOUNTER — Encounter (HOSPITAL_COMMUNITY): Payer: Self-pay | Admitting: General Practice

## 2015-04-02 ENCOUNTER — Emergency Department (HOSPITAL_COMMUNITY): Payer: BLUE CROSS/BLUE SHIELD

## 2015-04-02 ENCOUNTER — Observation Stay (HOSPITAL_COMMUNITY)
Admission: EM | Admit: 2015-04-02 | Discharge: 2015-04-03 | Disposition: A | Discharge status: Home / Self Care | Payer: BLUE CROSS/BLUE SHIELD | Attending: Cardiology | Admitting: Cardiology

## 2015-04-02 DIAGNOSIS — R000 Tachycardia, unspecified: Secondary | ICD-10-CM

## 2015-04-02 DIAGNOSIS — F329 Major depressive disorder, single episode, unspecified: Secondary | ICD-10-CM | POA: Insufficient documentation

## 2015-04-02 DIAGNOSIS — F419 Anxiety disorder, unspecified: Secondary | ICD-10-CM | POA: Diagnosis not present

## 2015-04-02 DIAGNOSIS — I480 Paroxysmal atrial fibrillation: Principal | ICD-10-CM | POA: Insufficient documentation

## 2015-04-02 DIAGNOSIS — Z7982 Long term (current) use of aspirin: Secondary | ICD-10-CM | POA: Diagnosis not present

## 2015-04-02 DIAGNOSIS — I4892 Unspecified atrial flutter: Secondary | ICD-10-CM | POA: Diagnosis not present

## 2015-04-02 DIAGNOSIS — K219 Gastro-esophageal reflux disease without esophagitis: Secondary | ICD-10-CM | POA: Insufficient documentation

## 2015-04-02 DIAGNOSIS — I10 Essential (primary) hypertension: Secondary | ICD-10-CM | POA: Diagnosis present

## 2015-04-02 DIAGNOSIS — I4891 Unspecified atrial fibrillation: Secondary | ICD-10-CM | POA: Diagnosis present

## 2015-04-02 DIAGNOSIS — Z8546 Personal history of malignant neoplasm of prostate: Secondary | ICD-10-CM | POA: Insufficient documentation

## 2015-04-02 HISTORY — DX: Unspecified atrial flutter: I48.92

## 2015-04-02 LAB — BASIC METABOLIC PANEL
Anion gap: 9 (ref 5–15)
BUN: 14 mg/dL (ref 6–20)
CO2: 26 mmol/L (ref 22–32)
Calcium: 9 mg/dL (ref 8.9–10.3)
Chloride: 104 mmol/L (ref 101–111)
Creatinine, Ser: 0.84 mg/dL (ref 0.61–1.24)
GFR calc Af Amer: >60 mL/min (ref >60)
GFR calc non Af Amer: >60 mL/min (ref >60)
Glucose, Bld: 102 mg/dL — ABNORMAL HIGH (ref 65–99)
Potassium: 3.5 mmol/L (ref 3.5–5.1)
Sodium: 139 mmol/L (ref 135–145)

## 2015-04-02 LAB — CBC
HCT: 41.4 % (ref 39.0–52.0)
Hemoglobin: 14.4 g/dL (ref 13.0–17.0)
MCH: 31.4 pg (ref 26.0–34.0)
MCHC: 34.8 g/dL (ref 30.0–36.0)
MCV: 90.2 fL (ref 78.0–100.0)
Platelets: 125 10*3/uL — ABNORMAL LOW (ref 150–400)
RBC: 4.59 MIL/uL (ref 4.22–5.81)
RDW: 12.2 % (ref 11.5–15.5)
WBC: 5.2 10*3/uL (ref 4.0–10.5)

## 2015-04-02 LAB — RAPID URINE DRUG SCREEN, HOSP PERFORMED
Amphetamines: NOT DETECTED
Barbiturates: NOT DETECTED
Benzodiazepines: NOT DETECTED
Cocaine: NOT DETECTED
Opiates: NOT DETECTED
Tetrahydrocannabinol: NOT DETECTED

## 2015-04-02 LAB — HEPATIC FUNCTION PANEL
ALT: 17 U/L (ref 17–63)
AST: 22 U/L (ref 15–41)
Albumin: 3.4 g/dL — ABNORMAL LOW (ref 3.5–5.0)
Alkaline Phosphatase: 38 U/L (ref 38–126)
Bilirubin, Direct: 0.2 mg/dL (ref 0.1–0.5)
Indirect Bilirubin: 1.1 mg/dL — ABNORMAL HIGH (ref 0.3–0.9)
Total Bilirubin: 1.3 mg/dL — ABNORMAL HIGH (ref 0.3–1.2)
Total Protein: 6.1 g/dL — ABNORMAL LOW (ref 6.5–8.1)

## 2015-04-02 LAB — APTT: aPTT: 37 seconds (ref 24–37)

## 2015-04-02 LAB — PROTIME-INR
INR: 1.2 (ref 0.00–1.49)
Prothrombin Time: 15.3 seconds — ABNORMAL HIGH (ref 11.6–15.2)

## 2015-04-02 LAB — TSH: TSH: 0.847 u[IU]/mL (ref 0.350–4.500)

## 2015-04-02 LAB — MAGNESIUM: Magnesium: 2.1 mg/dL (ref 1.7–2.4)

## 2015-04-02 LAB — TROPONIN I
Troponin I: <0.03 ng/mL (ref <0.031)
Troponin I: <0.03 ng/mL (ref <0.031)

## 2015-04-02 LAB — T4, FREE: Free T4: 0.88 ng/dL (ref 0.61–1.12)

## 2015-04-02 MED ORDER — SODIUM CHLORIDE 0.9 % IV SOLN: 250.0000 mL | INTRAVENOUS | Status: DC

## 2015-04-02 MED ORDER — METOPROLOL TARTRATE 50 MG PO TABS
75.0000 mg | ORAL_TABLET | Freq: Two times a day (BID) | ORAL | Status: DC
  Administered 2015-04-02 – 2015-04-03 (×2): 75 mg via ORAL
  Filled 2015-04-02 (×5): qty 1

## 2015-04-02 MED ORDER — SERTRALINE HCL 50 MG PO TABS
50.0000 mg | ORAL_TABLET | Freq: Every day | ORAL | Status: DC
  Filled 2015-04-02 (×2): qty 1

## 2015-04-02 MED ORDER — ONDANSETRON HCL 4 MG/2ML IJ SOLN: 4.0000 mg | Freq: Four times a day (QID) | INTRAMUSCULAR | Status: DC | PRN

## 2015-04-02 MED ORDER — SODIUM CHLORIDE 0.9 % IV SOLN: INTRAVENOUS | Status: DC

## 2015-04-02 MED ORDER — METOPROLOL TARTRATE 25 MG PO TABS
25.0000 mg | ORAL_TABLET | Freq: Once | ORAL | Status: AC
  Administered 2015-04-02: 25 mg via ORAL
  Filled 2015-04-02: qty 1

## 2015-04-02 MED ORDER — RIVAROXABAN 20 MG PO TABS
20.0000 mg | ORAL_TABLET | Freq: Every day | ORAL | Status: DC
  Administered 2015-04-02: 20 mg via ORAL
  Filled 2015-04-02 (×4): qty 1

## 2015-04-02 MED ORDER — METOPROLOL TARTRATE 25 MG PO TABS
50.0000 mg | ORAL_TABLET | Freq: Once | ORAL | Status: AC
  Administered 2015-04-02: 50 mg via ORAL
  Filled 2015-04-02: qty 2

## 2015-04-02 MED ORDER — ACETAMINOPHEN 325 MG PO TABS: 650.0000 mg | ORAL_TABLET | ORAL | Status: DC | PRN

## 2015-04-02 MED ORDER — SODIUM CHLORIDE 0.9 % IJ SOLN: 3.0000 mL | INTRAMUSCULAR | Status: DC | PRN

## 2015-04-02 MED ORDER — POLYETHYLENE GLYCOL 3350 17 GM/SCOOP PO POWD: 1.0000 | Freq: Once | ORAL | Status: DC

## 2015-04-02 MED ORDER — SODIUM CHLORIDE 0.9 % IJ SOLN: 3.0000 mL | Freq: Two times a day (BID) | INTRAMUSCULAR | Status: DC

## 2015-04-02 MED ORDER — OMEPRAZOLE MAGNESIUM 20 MG PO TBEC: 20.0000 mg | DELAYED_RELEASE_TABLET | Freq: Every day | ORAL | Status: DC

## 2015-04-02 MED ORDER — LATANOPROST 0.005 % OP SOLN
1.0000 [drp] | Freq: Every day | OPHTHALMIC | Status: DC
  Filled 2015-04-02: qty 2.5

## 2015-04-02 MED ORDER — SODIUM CHLORIDE 0.9 % IV SOLN
INTRAVENOUS | Status: AC
  Administered 2015-04-02: 21:00:00 via INTRAVENOUS

## 2015-04-02 MED ORDER — SODIUM CHLORIDE 0.9 % IV BOLUS (SEPSIS)
1000.0000 mL | Freq: Once | INTRAVENOUS | Status: AC
  Administered 2015-04-02: 1000 mL via INTRAVENOUS

## 2015-04-02 MED ORDER — PANTOPRAZOLE SODIUM 40 MG PO TBEC
40.0000 mg | DELAYED_RELEASE_TABLET | Freq: Every day | ORAL | Status: DC
  Administered 2015-04-02 – 2015-04-03 (×2): 40 mg via ORAL
  Filled 2015-04-02 (×2): qty 1

## 2015-04-02 MED ORDER — CLORAZEPATE DIPOTASSIUM 3.75 MG PO TABS
3.7500 mg | ORAL_TABLET | ORAL | Status: DC
  Administered 2015-04-03: 3.75 mg via ORAL
  Filled 2015-04-02: qty 1

## 2015-04-02 MED ORDER — ALPRAZOLAM 0.25 MG PO TABS
0.2500 mg | ORAL_TABLET | Freq: Two times a day (BID) | ORAL | Status: DC | PRN
Start: 2015-04-02 — End: 2015-04-03

## 2015-04-02 MED ORDER — METOPROLOL TARTRATE 25 MG PO TABS
50.0000 mg | ORAL_TABLET | Freq: Once | ORAL | Status: DC
  Filled 2015-04-02: qty 2

## 2015-04-02 NOTE — ED Notes (Signed)
PT placed in gown and in bed. Pt monitored by pulse ox, bp cuff, and 12-lead. 
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
## 2015-04-02 NOTE — H&P (Signed)
Tommy [name] is an 64 y.o. male.    Primary Cardiologist:NEW  SJG:GEZMO,QHUTML DENNIS, MD  Chief Complaint: came by EMS with heart palpitations    HPI: 64 year old male presents with palpitations with fast heart rate started around 5 AM. He got up like normal to urinate while he is walking to the bathroom he felt short of breath with palpitations. When he rested and the palpitations went away his shortness of breath went away. No nausea or diaphoresis.  Patient then had to get up again and hour later and felt similar symptoms although less severe. Since it was almost like a heaviness to his chest with things is more the heart rate beating fast. Does not remember specific pain. Called EMS and in ER a flutter rate controlled. Patient is on metoprolol twice per day for chronic palpitations since early 2000s. He was told he had PVCs but is never been told he has atrial fibrillation. 3 weeks ago he had a similar symptom at work but after resting it went away after lying down in floor and cooling off.  Lasted 30 min.   He frequently gets 1 or 2 seconds of palpitations with shortness of breath but has never had one lasting this long. No leg swelling or leg pain.  He did have one other episode several years ago but was not as dramatic as the most recent episodes.  Troponin is negative.  Thought was to allow pt to go home on higher dose BB but pt symptomatic with standing.  No hx of seeing cardiologist or chest pain.  Pt is 6'8".  CHA2DS2VASc score 1.    Past Medical History  Diagnosis Date  . Hypertension   . Palpitations   . Bone tumor     BONE TUMORS LEFT UPPER ARM, ABOVE LEFT KNEE AND MAYBE IN JAWS --TOLD BENIGN   . Heart murmur     TOLD HE HAS A SLIGHT HEART MURMUR  . Anxiety   . Depression   . Asthma     ASTHMA AS A CHILD - NO PROBLEMS NOW  . GERD (gastroesophageal reflux disease)     NOT A RECENT PROBLEM - BUT DID TAKE OMEPRAZOLE COUPLE OF MONTHS AGO   . Headache(784.0)    MIGRAINES  . Cancer     PROSTATE CANCER  . PONV (postoperative nausea and vomiting)   . Pain     HX OF LOWER BACK PAIN AND RUPTURED DISK 2001 -BUT NO SURGERY  . Atrial flutter with controlled response 04/02/2015    Past Surgical History  Procedure Laterality Date  . Bone tumor excision  1973    LEFT LEG  . Hernia repair      RIGHT ING 1968 AND LEFT ING 1994 WITH MESH  . Benign skin tumor excised left armpit  ? 1972    . Robot assisted laparoscopic radical prostatectomy N/A 04/13/2013    Procedure: ROBOTIC ASSISTED LAPAROSCOPIC RADICAL PROSTATECTOMY LEVEL 3;  Surgeon: Dutch Gray, MD;  Location: [facility];  Service: Urology;  Laterality: N/A;    Family History  Problem Relation Age of Onset  . Heart disease Mother     CABG  . Heart failure Mother   . Cancer Father   . Cirrhosis Father   . Healthy Sister   . Healthy Brother    Social History:  reports that he quit smoking about 44 years ago. His smoking use included Cigarettes, Pipe, and Cigars. He has never used smokeless tobacco. He reports that he  drinks about 0.6 oz of alcohol per week. He reports that he does not use illicit drugs.  Allergies:  Allergies  Allergen Reactions  . Ibuprofen     Sleepwalking  . Paxil [Paroxetine Hcl] Other (See Comments)    Droopy face/disoriented.  Ebbie Ridge [Pseudoephedrine]     Causes a "rebound headache"     OUTPATIENT MEDICATIONS: No current facility-administered medications on file prior to encounter.   Current Outpatient Prescriptions on File Prior to Encounter  Medication Sig Dispense Refill  . chlorpheniramine (CHLOR-TRIMETON) 4 MG tablet Take 2 mg by mouth 2 (two) times daily as needed for allergies.     . clorazepate (TRANXENE) 3.75 MG tablet Take 3.75 mg by mouth 2 (two) times a week.     . latanoprost (XALATAN) 0.005 % ophthalmic solution Place 1 drop into both eyes at bedtime. Alternates 1 drop in each eye daily    . metoprolol succinate (TOPROL-XL) 50 MG 24 hr tablet Take 50  mg by mouth 2 (two) times daily.       Results for orders placed or performed during the hospital encounter of 04/02/15 (from the past 48 hour(s))  Basic metabolic panel     Status: Abnormal   Collection Time: 04/02/15  9:08 AM  Result Value Ref Range   Sodium 139 135 - 145 mmol/L   Potassium 3.5 3.5 - 5.1 mmol/L   Chloride 104 101 - 111 mmol/L   CO2 26 22 - 32 mmol/L   Glucose, Bld 102 (H) 65 - 99 mg/dL   BUN 14 6 - 20 mg/dL   Creatinine, Ser 0.84 0.61 - 1.24 mg/dL   Calcium 9.0 8.9 - 10.3 mg/dL   GFR calc non Af Amer >60 >60 mL/min   GFR calc Af Amer >60 >60 mL/min    Comment: (NOTE) The eGFR has been calculated using the CKD EPI equation. This calculation has not been validated in all clinical situations. eGFR's persistently <60 mL/min signify possible Chronic Kidney Disease.    Anion gap 9 5 - 15  CBC     Status: Abnormal   Collection Time: 04/02/15  9:08 AM  Result Value Ref Range   WBC 5.2 4.0 - 10.5 K/uL   RBC 4.59 4.22 - 5.81 MIL/uL   Hemoglobin 14.4 13.0 - 17.0 g/dL   HCT 41.4 39.0 - 52.0 %   MCV 90.2 78.0 - 100.0 fL   MCH 31.4 26.0 - 34.0 pg   MCHC 34.8 30.0 - 36.0 g/dL   RDW 12.2 11.5 - 15.5 %   Platelets 125 (L) 150 - 400 K/uL  Troponin I     Status: None   Collection Time: 04/02/15  9:08 AM  Result Value Ref Range   Troponin I <0.03 <0.031 ng/mL    Comment:        NO INDICATION OF MYOCARDIAL INJURY.   Urine rapid drug screen (hosp performed)     Status: None   Collection Time: 04/02/15 12:14 PM  Result Value Ref Range   Opiates NONE DETECTED NONE DETECTED   Cocaine NONE DETECTED NONE DETECTED   Benzodiazepines NONE DETECTED NONE DETECTED   Amphetamines NONE DETECTED NONE DETECTED   Tetrahydrocannabinol NONE DETECTED NONE DETECTED   Barbiturates NONE DETECTED NONE DETECTED    Comment:        DRUG SCREEN FOR MEDICAL PURPOSES ONLY.  IF CONFIRMATION IS NEEDED FOR ANY PURPOSE, NOTIFY LAB WITHIN 5 DAYS.        LOWEST DETECTABLE LIMITS FOR URINE DRUG  SCREEN Drug Class       Cutoff (ng/mL) Amphetamine      1000 Barbiturate      200 Benzodiazepine   597 Tricyclics       416 Opiates          300 Cocaine          300 THC              50    Dg Chest 2 View  04/02/2015   CLINICAL DATA:  Atrial fibrillation; shortness of breath  EXAM: CHEST  2 VIEW  COMPARISON:  October 20, 2011  FINDINGS: Lungs are mildly hyperexpanded but clear. No edema or consolidation. The heart size and pulmonary vascularity are within normal limits. No adenopathy. No bone lesions.  IMPRESSION: Lungs mildly hyperexpanded but clear. Cardiac silhouette within normal limits.   Electronically Signed   By: Lowella Grip III M.D.   On: 04/02/2015 09:54    ROS:  General:no colds or fevers, no weight changes Skin:no rashes or ulcers HEENT:no blurred vision, no congestion CV:see HPI PUL:see HPI GI:no diarrhea constipation or melena, no indigestion GU:no hematuria, no dysuria MS:no joint pain, no claudication Neuro:no syncope, Occ lightheadedness if turns quickly  Endo:no diabetes, no thyroid disease  Blood pressure 115/79, pulse 74, temperature 98.1 F (36.7 C), temperature source Oral, resp. rate 10, SpO2 99 %. PE: General:Pleasant affect, NAD Skin:Warm and dry, brisk capillary refill HEENT:normocephalic, sclera clear, mucus membranes moist Neck:supple, no JVD, no bruits  Heart:iRReg  without murmur, gallup, rub or click Lungs:clear without rales, rhonchi, or wheezes LAG:TXMI, non tender, + BS, do not palpate liver spleen or masses Ext:no lower ext edema, 2+ pedal pulses, 2+ radial pulses Neuro:alert and oriented X 3, MAE, follows commands, + facial symmetry    Assessment/Plan Principal Problem:   Atrial flutter, paroxysmal- episode 3 weeks ago and then today that began around 0500, at times rate controlled other times tachycardic.  He has had increased BB today.  Admit add NOAC with plan for DCCV if pt does not convert.  Will check TEE before DCCV - if  converts on his own will then order TTE.  He will need outpt stress test  He may need antiarrythmic vs. A flutter ablation.  He will have him follow up with A fib clinic.   See Dr. Allison Quarry note.    Active Problems:   HTN (hypertension) controlled with BB   Tachycardia- currently rate controlled, but with activity rate increases.     [place] Nurse Practitioner Certified [place] Pager [phone] or after 5pm or weekends call [phone] 04/02/2015, 4:06 PM   I have seen, examined and evaluated the patient this PM along with Cecilie Kicks, [name].  After reviewing all the available data and chart,  I agree with her findings, examination as well as impression recommendations.  New Dx of what looks like primarily Atrial Flutter - now pretty well rate controlled while supine, but he goes from 4:1 up to 3:1 & 2:1 with ambulating. Original plan was increase BB & plan OP f/u in Afib Clinic, but given his level of Sx, I would prefer to gradually push rate control & initiate anticoagulation tonite with Xarelto with the intent of TEE DCCV tomorrow (confirmed with Cardiology Pharmacist - Elberta Leatherwood -- that this is an acceptable approach in lieu of 3 doses).  He thinks his Sx began ~3-4 AM.  Sx have ben recurrent, but this time more prolonged (last episodes lasted <  1 hr).    Would only require 1 month Xarelto post DCCV.  Would them plan OP Myoview & f/u in Afib Clinic - to determine rate vs. Rhythm control (with meds vs. Ablation).    Hopefully if able to DCCV tomorrow (having issues with scheduling) - can d/c home.    Leonie Man, M.D., M.S. Interventional Cardiologist   Pager # [phone]

## 2015-04-02 NOTE — ED Notes (Signed)
Pt brought in via [facility] with complaints of heart palpitations starting a 0500 this morning. Pt is A/O. Pt denies any SOB, N/V, diaphoresis, or CP. Pt has a history of intermittent irregular heart rate but pt is not taking any blood thinners.

## 2015-04-02 NOTE — ED Notes (Signed)
Ordered HH tray  

## 2015-04-02 NOTE — ED Notes (Signed)
Cardiology at bedside.

## 2015-04-03 ENCOUNTER — Observation Stay (HOSPITAL_COMMUNITY): Payer: BLUE CROSS/BLUE SHIELD

## 2015-04-03 DIAGNOSIS — I480 Paroxysmal atrial fibrillation: Secondary | ICD-10-CM | POA: Diagnosis not present

## 2015-04-03 DIAGNOSIS — I4891 Unspecified atrial fibrillation: Secondary | ICD-10-CM | POA: Diagnosis not present

## 2015-04-03 DIAGNOSIS — F419 Anxiety disorder, unspecified: Secondary | ICD-10-CM | POA: Diagnosis not present

## 2015-04-03 DIAGNOSIS — I4892 Unspecified atrial flutter: Secondary | ICD-10-CM | POA: Diagnosis not present

## 2015-04-03 DIAGNOSIS — I10 Essential (primary) hypertension: Secondary | ICD-10-CM | POA: Diagnosis not present

## 2015-04-03 HISTORY — PX: TRANSTHORACIC ECHOCARDIOGRAM: SHX275

## 2015-04-03 LAB — LIPID PANEL
Cholesterol: 178 mg/dL (ref 0–200)
HDL: 62 mg/dL (ref >40)
LDL Cholesterol: 105 mg/dL — ABNORMAL HIGH (ref 0–99)
Total CHOL/HDL Ratio: 2.9 RATIO
Triglycerides: 57 mg/dL (ref <150)
VLDL: 11 mg/dL (ref 0–40)

## 2015-04-03 LAB — CBC
HCT: 43.2 % (ref 39.0–52.0)
Hemoglobin: 15 g/dL (ref 13.0–17.0)
MCH: 31.4 pg (ref 26.0–34.0)
MCHC: 34.7 g/dL (ref 30.0–36.0)
MCV: 90.4 fL (ref 78.0–100.0)
Platelets: 130 10*3/uL — ABNORMAL LOW (ref 150–400)
RBC: 4.78 MIL/uL (ref 4.22–5.81)
RDW: 12.4 % (ref 11.5–15.5)
WBC: 6.9 10*3/uL (ref 4.0–10.5)

## 2015-04-03 LAB — BASIC METABOLIC PANEL
Anion gap: 7 (ref 5–15)
BUN: 12 mg/dL (ref 6–20)
CO2: 29 mmol/L (ref 22–32)
Calcium: 8.9 mg/dL (ref 8.9–10.3)
Chloride: 103 mmol/L (ref 101–111)
Creatinine, Ser: 1.03 mg/dL (ref 0.61–1.24)
GFR calc Af Amer: >60 mL/min (ref >60)
GFR calc non Af Amer: >60 mL/min (ref >60)
Glucose, Bld: 115 mg/dL — ABNORMAL HIGH (ref 65–99)
Potassium: 3.8 mmol/L (ref 3.5–5.1)
Sodium: 139 mmol/L (ref 135–145)

## 2015-04-03 LAB — TROPONIN I
Troponin I: <0.03 ng/mL (ref <0.031)
Troponin I: <0.03 ng/mL (ref <0.031)

## 2015-04-03 LAB — HEMOGLOBIN A1C
Hgb A1c MFr Bld: 5.5 % (ref 4.8–5.6)
Mean Plasma Glucose: 111 mg/dL

## 2015-04-03 MED ORDER — METOPROLOL TARTRATE 75 MG PO TABS: 75.0000 mg | ORAL_TABLET | Freq: Two times a day (BID) | ORAL | Status: DC

## 2015-04-03 MED ORDER — ACETAMINOPHEN 325 MG PO TABS: 650.0000 mg | ORAL_TABLET | ORAL | Status: AC | PRN

## 2015-04-03 MED ORDER — APIXABAN 5 MG PO TABS
5.0000 mg | ORAL_TABLET | Freq: Two times a day (BID) | ORAL | Status: DC
  Administered 2015-04-03: 5 mg via ORAL
  Filled 2015-04-03: qty 1

## 2015-04-03 MED ORDER — OFF THE BEAT BOOK
Freq: Once | Status: AC
  Administered 2015-04-03: 09:00:00
  Filled 2015-04-03: qty 1

## 2015-04-03 MED ORDER — APIXABAN 5 MG PO TABS: 5.0000 mg | ORAL_TABLET | Freq: Two times a day (BID) | ORAL | Status: DC

## 2015-04-03 NOTE — Progress Notes (Signed)
Pt discharged home with brother.  Alert and oriented x4.  No c/o pain.  IV D/Cd. Tele D/Cd.  Education given on diet, activity, meds, eliquis, atrial fibrillation and follow-up care and appointments.  Pt verbalized understanding.  Eliquis given prior to departure.

## 2015-04-03 NOTE — Progress Notes (Signed)
ANTICOAGULATION CONSULT NOTE - Initial Consult  Pharmacy Consult for Eliquis Indication: atrial fibrillation  Allergies  Allergen Reactions  . Ibuprofen     Sleepwalking  . Paxil [Paroxetine Hcl] Other (See Comments)    Droopy face/disoriented.  Tommy [name] [Pseudoephedrine]     Causes a "rebound headache"     Patient Measurements: Height: 6\' 8"  (203.2 cm) Weight: 197 lb 15.6 oz (89.8 kg) IBW/kg (Calculated) : 96  Vital Signs: Temp: 98 F (36.7 C) (07/13 0326) Temp Source: Oral (07/13 0326) BP: 103/72 mmHg (07/13 0326) Pulse Rate: 77 (07/13 0326)  Labs:  Recent Labs  04/02/15 0908 04/02/15 1644 04/02/15 1856 04/02/15 2313 04/03/15 0408  HGB 14.4  --   --   --  15.0  HCT 41.4  --   --   --  43.2  PLT 125*  --   --   --  130*  APTT  --   --  37  --   --   LABPROT  --  15.3*  --   --   --   INR  --  1.20  --   --   --   CREATININE 0.84  --   --   --  1.03  TROPONINI <0.03 <0.03  --  <0.03 <0.03    Estimated Creatinine Clearance: 95.7 mL/min (by C-G formula based on Cr of 1.03).   Medical History: Past Medical History  Diagnosis Date  . Hypertension   . Palpitations   . Bone tumor     BONE TUMORS LEFT UPPER ARM, ABOVE LEFT KNEE AND MAYBE IN JAWS --TOLD BENIGN   . Heart murmur     TOLD HE HAS A SLIGHT HEART MURMUR  . Anxiety   . Depression   . Asthma     ASTHMA AS A CHILD - NO PROBLEMS NOW  . GERD (gastroesophageal reflux disease)     NOT A RECENT PROBLEM - BUT DID TAKE OMEPRAZOLE COUPLE OF MONTHS AGO   . Headache(784.0)     MIGRAINES  . Cancer     PROSTATE CANCER  . PONV (postoperative nausea and vomiting)   . Pain     HX OF LOWER BACK PAIN AND RUPTURED DISK 2001 -BUT NO SURGERY  . Atrial flutter with controlled response 04/02/2015   Assessment: Pt w/ non-valvular afib / no anticoag PTA CrCl 95.7 / SCr 1.03 Baseline CBC within normal limits  Patient did receive xarelto dose last night, will schedule first dose of apixaban for this evening but  will discuss with nurse that he receives prior to discharge.  This patients CHA2DS2-VASc Score and unadjusted Ischemic Stroke Rate (% per year) is equal to 0.6 % stroke rate/year from a score of 1  Above score calculated as 1 point each if present [CHF, HTN, DM, Vascular=MI/PAD/Aortic Plaque, Age if 65-74, or Male] Above score calculated as 2 points each if present [Age > 75, or Stroke/TIA/TE]  Goal of Therapy:  Appropriate anticoagulation Monitor platelets by anticoagulation protocol: Yes   Plan:  Initiate apixaban 5mg  BID  Will provide education prior to discharge  Conrad [place], PharmD Pharmacy Resident  04/03/2015,11:15 AM   Erin Hearing PharmD., BCPS Clinical Pharmacist Pager [phone] 04/03/2015 11:32 AM

## 2015-04-03 NOTE — Progress Notes (Signed)
Echocardiogram 2D Echocardiogram has been performed.  Tommy [name] 04/03/2015, 1:58 PM

## 2015-04-03 NOTE — Progress Notes (Signed)
H.R, goes up with activity 140's. Patient goes in and out of At. Fib and At. Flutter

## 2015-04-03 NOTE — Progress Notes (Signed)
Subjective:  Anxious, lots of questions (appropriate) about cardioversion and atrial fibrillation.  Objective:  Vital Signs in the last 24 hours: Temp:  [97.8 F (36.6 C)-98.3 F (36.8 C)] 98 F (36.7 C) (07/13 0326) Pulse Rate:  [48-100] 77 (07/13 0326) Resp:  [7-20] 20 (07/13 0326) BP: (102-129)/(69-90) 103/72 mmHg (07/13 0326) SpO2:  [98 %-100 %] 100 % (07/13 0326) Weight:  [197 lb 15.6 oz (89.8 kg)-198 lb (89.812 kg)] 197 lb 15.6 oz (89.8 kg) (07/13 0326)  Intake/Output from previous day:  Intake/Output Summary (Last 24 hours) at 04/03/15 3710 Last data filed at 04/03/15 0800  Gross per 24 hour  Intake   2080 ml  Output   1650 ml  Net    430 ml    Physical Exam: General appearance: alert, cooperative and no distress Neck: no JVD Lungs: clear to auscultation bilaterally Heart: irregularly irregular rhythm Abdomen: soft, non-tender; bowel sounds normal; no masses,  no organomegaly Skin: Skin color, texture, turgor normal. No rashes or lesions Neurologic: Grossly normal   Rate: 78  Rhythm: atrial fibrillation  Lab Results:  Recent Labs  04/02/15 0908 04/03/15 0408  WBC 5.2 6.9  HGB 14.4 15.0  PLT 125* 130*    Recent Labs  04/02/15 0908 04/03/15 0408  NA 139 139  K 3.5 3.8  CL 104 103  CO2 26 29  GLUCOSE 102* 115*  BUN 14 12  CREATININE 0.84 1.03    Recent Labs  04/02/15 2313 04/03/15 0408  TROPONINI <0.03 <0.03    Recent Labs  04/02/15 1644  INR 1.20    Scheduled Meds: . clorazepate  3.75 mg Oral Once per day on Wed Thu  . latanoprost  1 drop Both Eyes QHS  . metoprolol tartrate  75 mg Oral BID  . off the beat book   Does not apply Once  . pantoprazole  40 mg Oral Daily  . rivaroxaban  20 mg Oral Q supper  . sertraline  50 mg Oral Daily  . sodium chloride  3 mL Intravenous Q12H   Continuous Infusions: . sodium chloride Stopped (04/02/15 2106)  . sodium chloride 10 mL/hr at 04/03/15 0311  . sodium chloride 20 mL/hr at  04/03/15 0800   PRN Meds:.acetaminophen, ondansetron (ZOFRAN) IV, sodium chloride   Imaging: Dg Chest 2 View  04/02/2015   CLINICAL DATA:  Atrial fibrillation; shortness of breath  EXAM: CHEST  2 VIEW  COMPARISON:  October 20, 2011  FINDINGS: Lungs are mildly hyperexpanded but clear. No edema or consolidation. The heart size and pulmonary vascularity are within normal limits. No adenopathy. No bone lesions.  IMPRESSION: Lungs mildly hyperexpanded but clear. Cardiac silhouette within normal limits.   Electronically Signed   By: Lowella Grip III M.D.   On: 04/02/2015 09:54    Assessment/Plan:  64 year old male presents with palpitations with fast heart rate started around 5 AM 04/02/15. He was found to be in atrial flutter. CHADs VASc =1 for HTN.  He has a long history of palpitations. He saw Dr Rex Kras several years ago and was told he had PVCs- no history of prior AF. He is now in atrial fibrillation with CVR.   Principal Problem:   PAF (paroxysmal atrial fibrillation) Active Problems:   HTN (hypertension)   Anxiety   PLAN: See Dr Allison Quarry recommendations 04/02/15 (based on pt being in atrial flutter). Unable to do cardioversion today secondary to schedule. He is scheduled for 10 am tomorrow with Dr Debara Pickett. He is very anxious  and symptomatic with any exertion, keep in hospital till cardioversion is done.    He is now in atrial fibrillation ? Does this change the recommendation for short term vs  Long term Xarelto post cardioversion-will discuss with MD. CHADs VASc =1 for HTN   Kerin Ransom PA-C 04/03/2015, 9:22 AM [phone]  Attending Note:   The patient was seen and examined.  Agree with assessment and plan as noted above.  Changes made to the above note as needed.  Exam : Very tall, thin,  Some marfans features  Lungs clear Cor:  RR , Soft diastolic murmur , c/w AI Long thin fingers   The patient has converted to NSR this am He was placed on a higher dose of  metoprolol  We discussed anticoagulation.  I would favor several months of Eliquis 5 BID  and see how much AFib he has. Once he's on NOAC, he is at a lower risk of CVA  CHADS2VASC =1 (HTN)  DC to home today  Will get the echo here if it can be done soon , otherwise it would be ok for him to get it in the office this week or next .    Thayer Headings, Brooke Bonito., MD, [facility] 04/03/2015, 10:18 AM [address],  [place] Pager [phone]

## 2015-04-03 NOTE — Discharge Instructions (Addendum)
Atrial Fibrillation °Atrial fibrillation is a type of irregular heart rhythm (arrhythmia). During atrial fibrillation, the upper chambers of the heart (atria) quiver continuously in a chaotic pattern. This causes an irregular and often rapid heart rate.  °Atrial fibrillation is the result of the heart becoming overloaded with disorganized signals that tell it to beat. These signals are normally released one at a time by a part of the right atrium called the sinoatrial node. They then travel from the atria to the lower chambers of the heart (ventricles), causing the atria and ventricles to contract and pump blood as they pass. In atrial fibrillation, parts of the atria outside of the sinoatrial node also release these signals. This results in two problems. First, the atria receive so many signals that they do not have time to fully contract. Second, the ventricles, which can only receive one signal at a time, beat irregularly and out of rhythm with the atria.  °There are three types of atrial fibrillation:  °· Paroxysmal. Paroxysmal atrial fibrillation starts suddenly and stops on its own within a week. °· Persistent. Persistent atrial fibrillation lasts for more than a week. It may stop on its own or with treatment. °· Permanent. Permanent atrial fibrillation does not go away. Episodes of atrial fibrillation may lead to permanent atrial fibrillation. °Atrial fibrillation can prevent your heart from pumping blood normally. It increases your risk of stroke and can lead to heart failure.  °CAUSES  °· Heart conditions, including a heart attack, heart failure, coronary artery disease, and heart valve conditions.   °· Inflammation of the sac that surrounds the heart (pericarditis). °· Blockage of an artery in the lungs (pulmonary embolism). °· Pneumonia or other infections. °· Chronic lung disease. °· Thyroid problems, especially if the thyroid is overactive (hyperthyroidism). °· Caffeine, excessive alcohol use, and use  of some illegal drugs.   °· Use of some medicines, including certain decongestants and diet pills. °· Heart surgery.   °· Birth defects.   °Sometimes, no cause can be found. When this happens, the atrial fibrillation is called lone atrial fibrillation. The risk of complications from atrial fibrillation increases if you have lone atrial fibrillation and you are age 60 years or older. °RISK FACTORS °· Heart failure. °· Coronary artery disease. °· Diabetes mellitus.   °· High blood pressure (hypertension).   °· Obesity.   °· Other arrhythmias.   °· Increased age. °SIGNS AND SYMPTOMS  °· A feeling that your heart is beating rapidly or irregularly.   °· A feeling of discomfort or pain in your chest.   °· Shortness of breath.   °· Sudden light-headedness or weakness.   °· Getting tired easily when exercising.   °· Urinating more often than normal (mainly when atrial fibrillation first begins).   °In paroxysmal atrial fibrillation, symptoms may start and suddenly stop. °DIAGNOSIS  °Your health care provider may be able to detect atrial fibrillation when taking your pulse. Your health care provider may have you take a test called an ambulatory electrocardiogram (ECG). An ECG records your heartbeat patterns over a 24-hour period. You may also have other tests, such as: °· Transthoracic echocardiogram (TTE). During echocardiography, sound waves are used to evaluate how blood flows through your heart. °· Transesophageal echocardiogram (TEE). °· Stress test. There is more than one type of stress test. If a stress test is needed, ask your health care provider about which type is best for you. °· Chest X-ray exam. °· Blood tests. °· Computed tomography (CT). °TREATMENT  °Treatment may include: °· Treating any underlying conditions. For example, if you   have an overactive thyroid, treating the condition may correct atrial fibrillation.  Taking medicine. Medicines may be given to control a rapid heart rate or to prevent blood  clots, heart failure, or a stroke.  Having a procedure to correct the rhythm of the heart:  Electrical cardioversion. During electrical cardioversion, a controlled, low-energy shock is delivered to the heart through your skin. If you have chest pain, very low blood pressure, or sudden heart failure, this procedure may need to be done as an emergency.  Catheter ablation. During this procedure, heart tissues that send the signals that cause atrial fibrillation are destroyed.  Surgical ablation. During this surgery, thin lines of heart tissue that carry the abnormal signals are destroyed. This procedure can either be an open-heart surgery or a minimally invasive surgery. With the minimally invasive surgery, small cuts are made to access the heart instead of a large opening.  Pulmonary venous isolation. During this surgery, tissue around the veins that carry blood from the lungs (pulmonary veins) is destroyed. This tissue is thought to carry the abnormal signals. HOME CARE INSTRUCTIONS   Take medicines only as directed by your health care provider. Some medicines can make atrial fibrillation worse or recur.  If blood thinners were prescribed by your health care provider, take them exactly as directed. Too much blood-thinning medicine can cause bleeding. If you take too little, you will not have the needed protection against stroke and other problems.  Perform blood tests at home if directed by your health care provider. Perform blood tests exactly as directed.  Quit smoking if you smoke.  Do not drink alcohol.  Do not drink caffeinated beverages such as coffee, soda, and some teas. You may drink decaffeinated coffee, soda, or tea.   Maintain a healthy weight.Do not use diet pills unless your health care provider approves. They may make heart problems worse.   Follow diet instructions as directed by your health care provider.  Exercise regularly as directed by your health care  provider.  Keep all follow-up visits as directed by your health care provider. This is important. PREVENTION  The following substances can cause atrial fibrillation to recur:   Caffeinated beverages.  Alcohol.  Certain medicines, especially those used for breathing problems.  Certain herbs and herbal medicines, such as those containing ephedra or ginseng.  Illegal drugs, such as cocaine and amphetamines. Sometimes medicines are given to prevent atrial fibrillation from recurring. Proper treatment of any underlying condition is also important in helping prevent recurrence.  SEEK MEDICAL CARE IF:  You notice a change in the rate, rhythm, or strength of your heartbeat.  You suddenly begin urinating more frequently.  You tire more easily when exerting yourself or exercising. SEEK IMMEDIATE MEDICAL CARE IF:   You have chest pain, abdominal pain, sweating, or weakness.  You feel nauseous.  You have shortness of breath.  You suddenly have swollen feet and ankles.  You feel dizzy.  Your face or limbs feel numb or weak.  You have a change in your vision or speech. MAKE SURE YOU:   Understand these instructions.  Will watch your condition.  Will get help right away if you are not doing well or get worse. Document Released: 09/07/2005 Document Revised: 01/22/2014 Document Reviewed: 10/18/2012 [facility] Patient Information 2015 [place], [place]. This information is not intended to replace advice given to you by your health care provider. Make sure you discuss any questions you have with your health care provider.   Information on my medicine -  ELIQUIS (apixaban)  This medication education was reviewed with me or my healthcare representative as part of my discharge preparation.  The pharmacist that spoke with me during my hospital stay was:  Georgina Peer, [facility]  Why was Eliquis prescribed for you? Eliquis was prescribed for you to reduce the risk of a blood clot forming  that can cause a stroke if you have a medical condition called atrial fibrillation (a type of irregular heartbeat).  What do You need to know about Eliquis ? Take your Eliquis TWICE DAILY - one tablet in the morning and one tablet in the evening with or without food. If you have difficulty swallowing the tablet whole please discuss with your pharmacist how to take the medication safely.  Take Eliquis exactly as prescribed by your doctor and DO NOT stop taking Eliquis without talking to the doctor who prescribed the medication.  Stopping may increase your risk of developing a stroke.  Refill your prescription before you run out.  After discharge, you should have regular check-up appointments with your healthcare provider that is prescribing your Eliquis.  In the future your dose may need to be changed if your kidney function or weight changes by a significant amount or as you get older.  What do you do if you miss a dose? If you miss a dose, take it as soon as you remember on the same day and resume taking twice daily.  Do not take more than one dose of ELIQUIS at the same time to make up a missed dose.  Important Safety Information A possible side effect of Eliquis is bleeding. You should call your healthcare provider right away if you experience any of the following: ? Bleeding from an injury or your nose that does not stop. ? Unusual colored urine (red or dark brown) or unusual colored stools (red or black). ? Unusual bruising for unknown reasons. ? A serious fall or if you hit your head (even if there is no bleeding).  Some medicines may interact with Eliquis and might increase your risk of bleeding or clotting while on Eliquis. To help avoid this, consult your healthcare provider or pharmacist prior to using any new prescription or non-prescription medications, including herbals, vitamins, non-steroidal anti-inflammatory drugs (NSAIDs) and supplements.  This website has more  information on Eliquis (apixaban): http://www.eliquis.com/eliquis/home Atrial Fibrillation Atrial fibrillation is a condition that causes your heart to beat irregularly. It may also cause your heart to beat faster than normal. Atrial fibrillation can prevent your heart from pumping blood normally. It increases your risk of stroke and heart problems. HOME CARE  Take medications as told by your doctor.  Only take medications that your doctor says are safe. Some medications can make the condition worse or happen again.  If blood thinners were prescribed by your doctor, take them exactly as told. Too much can cause bleeding. Too little and you will not have the needed protection against stroke and other problems.  Perform blood tests at home if told by your doctor.  Perform blood tests exactly as told by your doctor.  Do not drink alcohol.  Do not drink beverages with caffeine such as coffee, soda, and some teas.  Maintain a healthy weight.  Do not use diet pills unless your doctor says they are safe. They may make heart problems worse.  Follow diet instructions as told by your doctor.  Exercise regularly as told by your doctor.  Keep all follow-up appointments. GET HELP IF:  You notice  a change in the speed, rhythm, or strength of your heartbeat.  You suddenly begin peeing (urinating) more often.  You get tired more easily when moving or exercising. GET HELP RIGHT AWAY IF:   You have chest or belly (abdominal) pain.  You feel sick to your stomach (nauseous).  You are short of breath.  You suddenly have swollen feet and ankles.  You feel dizzy.  You face, arms, or legs feel numb or weak.  There is a change in your vision or speech. MAKE SURE YOU:   Understand these instructions.  Will watch your condition.  Will get help right away if you are not doing well or get worse. Document Released: 06/16/2008 Document Revised: 01/22/2014 Document Reviewed:  10/18/2012 [facility] Patient Information 2015 [place], [place]. This information is not intended to replace advice given to you by your health care provider. Make sure you discuss any questions you have with your health care provider.

## 2015-04-03 NOTE — Discharge Summary (Signed)
Patient ID: Tommy [name],  MRN: [id number], DOB/AGE: 62-Aug-1952 62 y.o.  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 He was found to be in atrial flutter initially. He is a CHADs VASc =1 for HTN. He has a long history of palpitations. He saw Dr Rex Kras several years ago and was told he had PVCs- no history of prior AF. He was admitted and his home dose of Toprol 50 mg BID was changed to Metoprolol 75 mg BID. The initial plan was to start Xarelto, plan on TEE CV 04/03/15, and possibly stop Xarelto after a month.   When seen on rounds the morning of the 13th he was noted to be in atrial fibrillation with CVR. He was seen by Dr Acie Fredrickson. His TEE Cardioversion was rescheduled for 7/14 as the schedule was full on the 13th. Late in the afternoon on the 13th he converted spontaneously to NSR and Dr Acie Fredrickson feels he can be discharged. We will arrange f/u with Dr Ellyn Hack as an OP.  An echocardiogram was done before discharge and these results are pending. This will need to be followed up as an OP. He also suggested several months of anticoagulation (Eliquis) and this was prescribed.    Discharge Vitals:  Blood pressure 103/72, pulse 77, temperature 98 F (36.7 C), temperature source Oral, resp. rate 20, height 6\' 8"  (2.032 m), weight 197 lb 15.6 oz (89.8 kg), SpO2 100 %.    Labs: Results for orders placed or performed during the hospital encounter of 04/02/15 (from the past 24 hour(s))  Magnesium     Status: None   Collection Time: 04/02/15  4:44 PM  Result Value Ref Range   Magnesium 2.1 1.7 -  2.4 mg/dL  TSH     Status: None   Collection Time: 04/02/15  4:44 PM  Result Value Ref Range   TSH 0.847 0.350 - 4.500 uIU/mL  T4, free     Status: None   Collection Time: 04/02/15  4:44 PM  Result Value Ref Range   Free T4 0.88 0.61 - 1.12 ng/dL  Troponin I     Status: None   Collection Time: 04/02/15  4:44 PM  Result Value Ref Range   Troponin I <0.03 <0.031 ng/mL  Protime-INR     Status: Abnormal   Collection Time: 04/02/15  4:44 PM  Result Value Ref Range   Prothrombin Time 15.3 (H) 11.6 - 15.2 seconds   INR 1.20 0.00 - 1.49  Hepatic function panel     Status: Abnormal   Collection Time: 04/02/15  4:44 PM  Result Value Ref Range   Total Protein 6.1 (L) 6.5 - 8.1 g/dL   Albumin 3.4 (L) 3.5 - 5.0 g/dL   AST 22 15 - 41 U/L   ALT 17 17 - 63 U/L   Alkaline Phosphatase 38 38 - 126 U/L   Total Bilirubin 1.3 (H) 0.3 - 1.2 mg/dL   Bilirubin, Direct 0.2 0.1 - 0.5 mg/dL   Indirect Bilirubin 1.1 (H) 0.3 - 0.9 mg/dL  APTT     Status: None   Collection Time: 04/02/15  6:56  PM  Result Value Ref Range   aPTT 37 24 - 37 seconds  Troponin I     Status: None   Collection Time: 04/02/15 11:13 PM  Result Value Ref Range   Troponin I <0.03 <0.031 ng/mL  Troponin I     Status: None   Collection Time: 04/03/15  4:08 AM  Result Value Ref Range   Troponin I <0.03 <0.031 ng/mL  Basic metabolic panel     Status: Abnormal   Collection Time: 04/03/15  4:08 AM  Result Value Ref Range   Sodium 139 135 - 145 mmol/L   Potassium 3.8 3.5 - 5.1 mmol/L   Chloride 103 101 - 111 mmol/L   CO2 29 22 - 32 mmol/L   Glucose, Bld 115 (H) 65 - 99 mg/dL   BUN 12 6 - 20 mg/dL   Creatinine, Ser 1.03 0.61 - 1.24 mg/dL   Calcium 8.9 8.9 - 10.3 mg/dL   GFR calc non Af Amer >60 >60 mL/min   GFR calc Af Amer >60 >60 mL/min   Anion gap 7 5 - 15  Lipid panel     Status: Abnormal   Collection Time: 04/03/15  4:08 AM  Result Value Ref Range   Cholesterol 178 0 - 200 mg/dL   Triglycerides 57 <150 mg/dL   HDL 62  >40 mg/dL   Total CHOL/HDL Ratio 2.9 RATIO   VLDL 11 0 - 40 mg/dL   LDL Cholesterol 105 (H) 0 - 99 mg/dL  CBC     Status: Abnormal   Collection Time: 04/03/15  4:08 AM  Result Value Ref Range   WBC 6.9 4.0 - 10.5 K/uL   RBC 4.78 4.22 - 5.81 MIL/uL   Hemoglobin 15.0 13.0 - 17.0 g/dL   HCT 43.2 39.0 - 52.0 %   MCV 90.4 78.0 - 100.0 fL   MCH 31.4 26.0 - 34.0 pg   MCHC 34.7 30.0 - 36.0 g/dL   RDW 12.4 11.5 - 15.5 %   Platelets 130 (L) 150 - 400 K/uL    Disposition:      Follow-up Information    Schedule an appointment as soon as possible for a visit with Dwan Bolt, MD.   Specialty:  Endocrinology   Contact information:   [address] [place] [address] [phone]       Follow up with Leonie Man, MD.   Specialty:  Cardiology   Why:  this pt needs to see Dr Ellyn Hack or an APP on a day when Dr Ellyn Hack is in the office in approximatly two weeks.   Contact information:   [address] [place] [address] [phone]       Discharge Medications:    Medication List    STOP taking these medications        aspirin 325 MG tablet     chlorpheniramine 4 MG tablet  Commonly known as:  CHLOR-TRIMETON     metoprolol succinate 50 MG 24 hr tablet  Commonly known as:  TOPROL-XL      TAKE these medications        acetaminophen 325 MG tablet  Commonly known as:  TYLENOL  Take 2 tablets (650 mg total) by mouth every 4 (four) hours as needed for headache or mild pain.     apixaban 5 MG Tabs tablet  Commonly known as:  ELIQUIS  Take 1 tablet (5 mg total) by mouth 2 (two) times daily.     clorazepate 3.75  MG tablet  Commonly known as:  TRANXENE  Take 3.75 mg by mouth 2 (two) times a week.     clotrimazole 1 % cream  Commonly known as:  LOTRIMIN  Apply 1 application topically 2 (two) times daily.     latanoprost 0.005 % ophthalmic solution  Commonly known as:  XALATAN  Place 1 drop into both eyes at bedtime.  Alternates 1 drop in each eye daily     Metoprolol Tartrate 75 MG Tabs  Take 75 mg by mouth 2 (two) times daily.     nystatin ointment  Commonly known as:  MYCOSTATIN  Apply 1 application topically every morning.     omeprazole 20 MG tablet  Commonly known as:  PRILOSEC OTC  Take 10 mg by mouth daily.     polyethylene glycol powder powder  Commonly known as:  GLYCOLAX/MIRALAX  Take 1 Container by mouth once. Takes it a bit less     sertraline 50 MG tablet  Commonly known as:  ZOLOFT  Take 50 mg by mouth daily.     terbinafine 1 % cream  Commonly known as:  LAMISIL  Apply 1 application topically daily.     tolnaftate 1 % spray  Commonly known as:  TINACTIN  Apply 1 spray topically daily.         Duration of Discharge Encounter: Greater than 30 minutes including physician time.  Angelena Form PA-C 04/03/2015 2:16 PM   Attending Note:   The patient was seen and examined.  Agree with assessment and plan as noted above.  Changes made to the above note as needed.  See my progress note from earlier today  Patient has converted to NSR  Will continue Eliquis 5 BID .  Follow up with Dr. Marin Olp, Brooke Bonito., MD, [facility] 04/03/2015, 5:40 PM [address],  [place] Pager [phone]

## 2015-04-04 SURGERY — ECHOCARDIOGRAM, TRANSESOPHAGEAL
Anesthesia: Monitor Anesthesia Care

## 2015-04-15 ENCOUNTER — Other Ambulatory Visit (HOSPITAL_COMMUNITY): Payer: Self-pay | Admitting: *Deleted

## 2015-04-15 ENCOUNTER — Encounter (HOSPITAL_COMMUNITY): Payer: Self-pay | Admitting: Nurse Practitioner

## 2015-04-15 ENCOUNTER — Ambulatory Visit (HOSPITAL_COMMUNITY)
Admission: RE | Admit: 2015-04-15 | Discharge: 2015-04-15 | Disposition: A | Discharge status: Home / Self Care | Payer: BLUE CROSS/BLUE SHIELD | Source: Ambulatory Visit | Attending: Nurse Practitioner | Admitting: Nurse Practitioner

## 2015-04-15 VITALS — BP 116/88 | HR 61 | Ht >= 80 in | Wt 201.0 lb

## 2015-04-15 DIAGNOSIS — I480 Paroxysmal atrial fibrillation: Secondary | ICD-10-CM

## 2015-04-15 MED ORDER — APIXABAN 5 MG PO TABS: 5.0000 mg | ORAL_TABLET | Freq: Two times a day (BID) | ORAL | Status: DC

## 2015-04-15 NOTE — Patient Instructions (Signed)
Sleep study -- scheduler will call you regarding appointment set up.  48 hour monitor -- set up for Wednesday @ 2pm. [address]  Dr. Darcus Pester office will call you to set up appointment.

## 2015-04-16 ENCOUNTER — Telehealth: Payer: Self-pay | Admitting: *Deleted

## 2015-04-16 ENCOUNTER — Encounter (HOSPITAL_COMMUNITY): Payer: Self-pay | Admitting: Nurse Practitioner

## 2015-04-16 NOTE — Telephone Encounter (Signed)
Erroneous Encounter

## 2015-04-16 NOTE — Progress Notes (Signed)
Patient ID: Tommy [name], male   DOB: Jan 28, 1951, 64 y.o.   MRN: [id number]     Primary Care Physician: Dwan Bolt, MD Referring Physician:   KOHAN [name] is a 65 y.o. male presented to the ED 04/02/15 with palpitations which started around 5 AM 04/02/15. He was found to be in atrial flutter initially. He is a CHADs VASc =1 for HTN. He has a long history of palpitations. He saw Dr Rex Kras several years ago and was told he had PVCs- no history of prior AF. He was admitted and his home dose of Toprol 50 mg BID was changed to Metoprolol 75 mg BID. The initial plan was to start Xarelto, plan on TEE CV 04/03/15, and possibly stop Xarelto after a month.  When seen on rounds the morning of the 13th he was noted to be in atrial fibrillation with CVR. He was seen by Dr Acie Fredrickson. His TEE Cardioversion was rescheduled for 7/14 as the schedule was full on the 13th. Late in the afternoon on the 13th he converted spontaneously to NSR and Dr Acie Fredrickson felt he can be discharged.  An echocardiogram was done before discharge .Marland Kitchen  He also suggested several months of anticoagulation (Eliquis) and this was prescribed.   Pt is being seen f/u today in the afib clinc. He is in SR today and has not noted any sustained irregularity to his pulse but has felt some palpitations. He also has symptoms of sleep apnea, no previous study. He found apixaban to be very expensive for him so he has been cutting the tablet in half. He was informed that this put him at risk of stroke and to take full tablet. He will be given the 30 day free card today and a pt assistance number to call, can probably also help with samples if needed short term.. I also exlplained that with a chads vasc score of 1, drug would not have to be indefinite but to plan of taking it short term for a couple of months.Echo revealed normal Ef with l atrium at upper normal limits.  Today, he denies symptoms of palpitations, chest pain, shortness of breath,  orthopnea, PND, lower extremity edema, dizziness, presyncope, syncope, or neurologic sequela. The patient is tolerating medications without difficulties and is otherwise without complaint today.   Past Medical History  Diagnosis Date  . Hypertension   . Palpitations   . Bone tumor     BONE TUMORS LEFT UPPER ARM, ABOVE LEFT KNEE AND MAYBE IN JAWS --TOLD BENIGN   . Heart murmur     TOLD HE HAS A SLIGHT HEART MURMUR  . Anxiety   . Depression   . Asthma     ASTHMA AS A CHILD - NO PROBLEMS NOW  . GERD (gastroesophageal reflux disease)     NOT A RECENT PROBLEM - BUT DID TAKE OMEPRAZOLE COUPLE OF MONTHS AGO   . Headache(784.0)     MIGRAINES  . Cancer     PROSTATE CANCER  . PONV (postoperative nausea and vomiting)   . Pain     HX OF LOWER BACK PAIN AND RUPTURED DISK 2001 -BUT NO SURGERY  . Atrial flutter with controlled response 04/02/2015   Past Surgical History  Procedure Laterality Date  . Bone tumor excision  1973    LEFT LEG  . Hernia repair      RIGHT ING 1968 AND LEFT ING 1994 WITH MESH  . Benign skin tumor excised left armpit  ? 1972    .  Robot assisted laparoscopic radical prostatectomy N/A 04/13/2013    Procedure: ROBOTIC ASSISTED LAPAROSCOPIC RADICAL PROSTATECTOMY LEVEL 3;  Surgeon: Dutch Gray, MD;  Location: [facility];  Service: Urology;  Laterality: N/A;    Current Outpatient Prescriptions  Medication Sig Dispense Refill  . clorazepate (TRANXENE) 3.75 MG tablet Take 3.75 mg by mouth 2 (two) times a week.     . clotrimazole (LOTRIMIN) 1 % cream Apply 1 application topically 2 (two) times daily.    Marland Kitchen latanoprost (XALATAN) 0.005 % ophthalmic solution Place 1 drop into both eyes at bedtime. Alternates 1 drop in each eye daily    . Metoprolol Tartrate 75 MG TABS Take 75 mg by mouth 2 (two) times daily. 60 tablet 11  . nystatin ointment (MYCOSTATIN) Apply 1 application topically every morning.  0  . omeprazole (PRILOSEC OTC) 20 MG tablet Take 10 mg by mouth daily as needed.       . polyethylene glycol powder (GLYCOLAX/MIRALAX) powder Take 1 Container by mouth once. Takes it a bit less    . terbinafine (LAMISIL) 1 % cream Apply 1 application topically daily.    Marland Kitchen tolnaftate (TINACTIN) 1 % spray Apply 1 spray topically daily.    Marland Kitchen acetaminophen (TYLENOL) 325 MG tablet Take 2 tablets (650 mg total) by mouth every 4 (four) hours as needed for headache or mild pain. (Patient not taking: Reported on 04/15/2015)    . apixaban (ELIQUIS) 5 MG TABS tablet Take 1 tablet (5 mg total) by mouth 2 (two) times daily. 60 tablet 11  . sertraline (ZOLOFT) 50 MG tablet Take 50 mg by mouth daily.  0   No current facility-administered medications for this encounter.    Allergies  Allergen Reactions  . Ibuprofen     Sleepwalking  . Paxil [Paroxetine Hcl] Other (See Comments)    Droopy face/disoriented.  Ebbie Ridge [Pseudoephedrine]     Causes a "rebound headache"     History   Social History  . Marital Status: Single    Spouse Name: N/A  . Number of Children: N/A  . Years of Education: N/A   Occupational History  . Not on file.   Social History Main Topics  . Smoking status: Former Smoker    Types: Cigarettes, Pipe, Cigars    Quit date: 09/21/1970  . Smokeless tobacco: Never Used  . Alcohol Use: 0.6 oz/week    1 Glasses of wine per week     Comment: Occassionally   . Drug Use: No  . Sexual Activity: Not on file   Other Topics Concern  . Not on file   Social History Narrative    Family History  Problem Relation Age of Onset  . Heart disease Mother     CABG  . Heart failure Mother   . Cancer Father   . Cirrhosis Father   . Healthy Sister   . Healthy Brother     ROS- All systems are reviewed and negative except as per the HPI above  Physical Exam: Filed Vitals:   04/15/15 1422  BP: 116/88  Pulse: 61  Height: 6\' 8"  (2.032 m)  Weight: 201 lb (91.173 kg)    GEN- The patient is well appearing, alert and oriented x 3 today.   Head- normocephalic,  atraumatic Eyes-  Sclera clear, conjunctiva pink Ears- hearing intact Oropharynx- clear Neck- supple, no JVP Lymph- no cervical lymphadenopathy Lungs- Clear to ausculation bilaterally, normal work of breathing Heart- Regular rate and rhythm, no murmurs, rubs or gallops, PMI not  laterally displaced GI- soft, NT, ND, + BS Extremities- no clubbing, cyanosis, or edema MS- no significant deformity or atrophy Skin- no rash or lesion Psych- euthymic mood, full affect Neuro- strength and sensation are intact  EKG- Normal sinus rhythm, normal EKG. Epic records reviewed. Echo- Left ventricle: The cavity size was normal. Systolic function was normal. The estimated ejection fraction was in the range of 55% to 60%. Wall motion was normal; there were no regional wall motion abnormalities. - Aorta: Aortic root dimension: 45 mm (ED). - Ascending aorta: The ascending aorta was mildly dilated. - Mitral valve: There was mild regurgitation. - Right ventricle: The cavity size was mildly dilated. Wall thickness was normal. - Right atrium: The atrium was mildly dilated.  13d ago (04/03/15) 2wk ago (04/02/15) 72yr ago (04/10/13) 64yr ago (01/28/13)       Sodium 135 - 145 mmol/L 139 139 138R 141R    Potassium 3.5 - 5.1 mmol/L 3.8 3.5 3.8R 3.5R    Chloride 101 - 111 mmol/L 103 104 102R 101R    CO2 22 - 32 mmol/L 29 26 32R     Glucose, Bld 65 - 99 mg/dL 115 (H) 102 (H) 92R 93R    BUN 6 - 20 mg/dL 12 14 16R 18R    Creatinine, Ser 0.61 - 1.24 mg/dL 1.03 0.84 0.91R 0.90R    Calcium 8.9 - 10.3 mg/dL 8.9 9.0 9.3R     GFR calc non Af Amer >60 mL/min >60 60 mL/min" class="[email or web address]" style="cursor: pointer; background-color: rgb(222, 231, 239);" onmouseover='jscript: var varStyle="underline"; var bgColor="#[id number]"; this.style.backgroundColor=bgColor; var children=this.getElementsByTagName("div"); for(var child=0;child >60 90 mL/min" class="[email or web address]" style="cursor: pointer;" onmouseover='jscript: var  varStyle="underline"; var bgColor="#[id number]"; this.style.backgroundColor=bgColor; var children=this.getElementsByTagName("div"); for(var child=0;child >90R     GFR calc Af Amer >60 mL/min >60             6.9 5.2        RBC 4.22 - 5.81 MIL/uL 4.78 4.59      Hemoglobin 13.0 - 17.0 g/dL 15.0 14.4 11.1 (L) 11.4 (L)    HCT 39.0 - 52.0 % 43.2 41.4 32.4 (L) 33.2 (L)    MCV 78.0 - 100.0 fL 90.4 90.2      MCH 26.0 - 34.0 pg 31.4 31.4      MCHC 30.0 - 36.0 g/dL 34.7 34.8      RDW 11.5 - 15.5 % 12.4 12.2      Platelets 150 - 400 K/uL 130             Assessment and Plan: 1. PAF Currently in SR Chadsvasc score of 1 Continue apixaban  for now at 5 mg bid and advised to not cut pills in half to save money which would place him at risk of stroke. #30 day free trial given as well as assistance number 48 hour holter monitor for his c/o palpitations  2. Snoring history Sleep study scheduled  F/u with Dr. Ellyn Hack afib clinic as needed

## 2015-04-17 ENCOUNTER — Encounter: Payer: Self-pay | Admitting: *Deleted

## 2015-04-17 ENCOUNTER — Telehealth: Payer: Self-pay | Admitting: Cardiology

## 2015-04-17 NOTE — Progress Notes (Signed)
Patient ID: Tommy [name], male   DOB: 03-09-1951, 64 y.o.   MRN: [id number] Patient did not show up for  04/17/2015 2:00PM appointment to have a 48 hour holter monitor applied.

## 2015-04-17 NOTE — Telephone Encounter (Signed)
Closed encounter °

## 2015-04-23 ENCOUNTER — Telehealth: Payer: Self-pay | Admitting: *Deleted

## 2015-04-23 NOTE — Telephone Encounter (Signed)
Spoke to patient to discuss scheduling Sleep study.  He has concerns about scheduling this right now because of his financial status.  I informed him that they will pre-cert it before he goes, but he was still very hesitant about making the appointment.  He asked that I give him until the end of next week to make a decision. I let him know that I would do my best to see if I could get any idea about payments, and I would let him know when I call him back next week.

## 2015-05-14 ENCOUNTER — Telehealth (HOSPITAL_COMMUNITY): Payer: Self-pay | Admitting: Nurse Practitioner

## 2015-05-14 NOTE — Telephone Encounter (Signed)
Pt called wanting 30 day free sample card for eliquis. According to his note, he was suppose to stay on for 30 days past hospitalization and then with a chadsvasc of 1 stop drug unless continued with afib.  He can not afford out of his pocket.He has an appointment with Dr. Ellyn Hack on Monday. He has enough drug to last til that appointment. The decision can be made at that appointment to stop/continue drug as this is a financial hardship on him.

## 2015-05-20 ENCOUNTER — Encounter: Payer: Self-pay | Admitting: Cardiology

## 2015-05-20 ENCOUNTER — Ambulatory Visit (INDEPENDENT_AMBULATORY_CARE_PROVIDER_SITE_OTHER): Payer: BLUE CROSS/BLUE SHIELD | Admitting: Cardiology

## 2015-05-20 VITALS — BP 102/68 | HR 51 | Ht >= 80 in | Wt 199.0 lb

## 2015-05-20 DIAGNOSIS — Z7901 Long term (current) use of anticoagulants: Secondary | ICD-10-CM

## 2015-05-20 DIAGNOSIS — I480 Paroxysmal atrial fibrillation: Secondary | ICD-10-CM | POA: Diagnosis not present

## 2015-05-20 DIAGNOSIS — I10 Essential (primary) hypertension: Secondary | ICD-10-CM

## 2015-05-20 DIAGNOSIS — F419 Anxiety disorder, unspecified: Secondary | ICD-10-CM | POA: Diagnosis not present

## 2015-05-20 MED ORDER — APIXABAN 5 MG PO TABS
5.0000 mg | ORAL_TABLET | Freq: Two times a day (BID) | ORAL | Status: DC
Start: 2015-05-20 — End: 2016-07-15

## 2015-05-20 NOTE — Patient Instructions (Addendum)
CONTINUE WITH TAKING 3/4 of 75 mg of Metoprolol three times  A day  Continue with ELIQUIS .  Your physician wants you to follow-up in DEC/JAN 2016/17 WITH DR HARDING.  You will receive a reminder letter in the mail two months in advance. If you don't receive a letter, please call our office to schedule the follow-up appointment.  Cardiac Diet This diet can help prevent heart disease and stroke. Many factors influence your heart health, including eating and exercise habits. Coronary risk rises a lot with abnormal blood fat (lipid) levels. Cardiac meal planning includes limiting unhealthy fats, increasing healthy fats, and making other small dietary changes. General guidelines are as follows:  Adjust calorie intake to reach and maintain desirable body weight.  Limit total fat intake to less than 30% of total calories. Saturated fat should be less than 7% of calories.  Saturated fats are found in animal products and in some vegetable products. Saturated vegetable fats are found in coconut oil, cocoa butter, palm oil, and palm kernel oil. Read labels carefully to avoid these products as much as possible. Use butter in moderation. Choose tub margarines and oils that have 2 grams of fat or less. Good cooking oils are canola and olive oils.  Practice low-fat cooking techniques. Do not fry food. Instead, broil, bake, boil, steam, grill, roast on a rack, stir-fry, or microwave it. Other fat reducing suggestions include:  Remove the skin from poultry.  Remove all visible fat from meats.  Skim the fat off stews, soups, and gravies before serving them.  Steam vegetables in water or broth instead of sauting them in fat.  Avoid foods with trans fat (or hydrogenated oils), such as commercially fried foods and commercially baked goods. Commercial shortening and deep-frying fats will contain trans fat.  Increase intake of fruits, vegetables, whole grains, and legumes to replace foods high in  fat.  Increase consumption of nuts, legumes, and seeds to at least 4 servings weekly. One serving of a legume equals  cup, and 1 serving of nuts or seeds equals  cup.  Choose whole grains more often. Have 3 servings per day (a serving is 1 ounce [oz]).  Eat 4 to 5 servings of vegetables per day. A serving of vegetables is 1 cup of raw leafy vegetables;  cup of raw or cooked cut-up vegetables;  cup of vegetable juice.  Eat 4 to 5 servings of fruit per day. A serving of fruit is 1 medium whole fruit;  cup of dried fruit;  cup of fresh, frozen, or canned fruit;  cup of 100% fruit juice.  Increase your intake of dietary fiber to 20 to 30 grams per day. Insoluble fiber may help lower your risk of heart disease and may help curb your appetite.  Soluble fiber binds cholesterol to be removed from the blood. Foods high in soluble fiber are dried beans, citrus fruits, oats, apples, bananas, broccoli, Brussels sprouts, and eggplant.  Try to include foods fortified with plant sterols or stanols, such as yogurt, breads, juices, or margarines. Choose several fortified foods to achieve a daily intake of 2 to 3 grams of plant sterols or stanols.  Foods with omega-3 fats can help reduce your risk of heart disease. Aim to have a 3.5 oz portion of fatty fish twice per week, such as salmon, mackerel, albacore tuna, sardines, lake trout, or herring. If you wish to take a fish oil supplement, choose one that contains 1 gram of both DHA and EPA.  Limit processed  meats to 2 servings (3 oz portion) weekly.  Limit the sodium in your diet to 1500 milligrams (mg) per day. If you have high blood pressure, talk to a registered dietitian about a DASH (Dietary Approaches to Stop Hypertension) eating plan.  Limit sweets and beverages with added sugar, such as soda, to no more than 5 servings per week. One serving is:   1 tablespoon sugar.  1 tablespoon jelly or jam.   cup sorbet.  1 cup lemonade.   cup  regular soda. CHOOSING FOODS Starches  Allowed: Breads: All kinds (wheat, rye, raisin, white, oatmeal, [place], [place], and English muffin bread). Low-fat rolls: English muffins, frankfurter and hamburger buns, bagels, pita bread, tortillas (not fried). Pancakes, waffles, biscuits, and muffins made with recommended oil.  Avoid: Products made with saturated or trans fats, oils, or whole milk products. Butter rolls, cheese breads, croissants. Commercial doughnuts, muffins, sweet rolls, biscuits, waffles, pancakes, store-bought mixes. Crackers  Allowed: Low-fat crackers and snacks: Animal, graham, rye, saltine (with recommended oil, no lard), oyster, and matzo crackers. Bread sticks, melba toast, rusks, flatbread, pretzels, and light popcorn.  Avoid: High-fat crackers: cheese crackers, butter crackers, and those made with coconut, palm oil, or trans fat (hydrogenated oils). Buttered popcorn. Cereals  Allowed: Hot or cold whole-grain cereals.  Avoid: Cereals containing coconut, hydrogenated vegetable fat, or animal fat. Potatoes / Pasta / Rice  Allowed: All kinds of potatoes, rice, and pasta (such as macaroni, spaghetti, and noodles).  Avoid: Pasta or rice prepared with cream sauce or high-fat cheese. Chow mein noodles, [place] fries. Vegetables  Allowed: All vegetables and vegetable juices.  Avoid: Fried vegetables. Vegetables in cream, butter, or high-fat cheese sauces. Limit coconut. Fruit in cream or custard. Protein  Allowed: Limit your intake of meat, seafood, and poultry to no more than 6 oz (cooked weight) per day. All lean, well-trimmed beef, veal, pork, and lamb. All chicken and [place] without skin. All fish and shellfish. Wild game: wild duck, rabbit, pheasant, and venison. Egg whites or low-cholesterol egg substitutes may be used as desired. Meatless dishes: recipes with dried beans, peas, lentils, and tofu (soybean curd). Seeds and nuts: all seeds and most nuts.  Avoid: Prime  grade and other heavily marbled and fatty meats, such as short ribs, spare ribs, rib eye roast or steak, frankfurters, sausage, bacon, and high-fat luncheon meats, mutton. Caviar. Commercially fried fish. Domestic duck, goose, venison sausage. Organ meats: liver, gizzard, heart, chitterlings, brains, kidney, sweetbreads. Dairy  Allowed: Low-fat cheeses: nonfat or low-fat cottage cheese (1% or 2% fat), cheeses made with part skim milk, such as mozzarella, farmers, string, or ricotta. (Cheeses should be labeled no more than 2 to 6 grams fat per oz.). Skim (or 1%) milk: liquid, powdered, or evaporated. Buttermilk made with low-fat milk. Drinks made with skim or low-fat milk or cocoa. Chocolate milk or cocoa made with skim or low-fat (1%) milk. Nonfat or low-fat yogurt.  Avoid: Whole milk cheeses, including colby, cheddar, [place], Monterey Jack, [place], [place], [place], American, Swiss, and blue. Creamed cottage cheese, cream cheese. Whole milk and whole milk products, including buttermilk or yogurt made from whole milk, drinks made from whole milk. Condensed milk, evaporated whole milk, and 2% milk. Soups and Combination Foods  Allowed: Low-fat low-sodium soups: broth, dehydrated soups, homemade broth, soups with the fat removed, homemade cream soups made with skim or low-fat milk. Low-fat spaghetti, lasagna, chili, and Spanish rice if low-fat ingredients and low-fat cooking techniques are used.  Avoid: Cream soups made with  whole milk, cream, or high-fat cheese. All other soups. Desserts and Sweets  Allowed: Sherbet, fruit ices, gelatins, meringues, and angel food cake. Homemade desserts with recommended fats, oils, and milk products. Jam, jelly, honey, marmalade, sugars, and syrups. Pure sugar candy, such as gum drops, hard candy, jelly beans, marshmallows, mints, and small amounts of dark chocolate.  Avoid: Commercially prepared cakes, pies, cookies, frosting, pudding, or mixes for these products.  Desserts containing whole milk products, chocolate, coconut, lard, palm oil, or palm kernel oil. Ice cream or ice cream drinks. Candy that contains chocolate, coconut, butter, hydrogenated fat, or unknown ingredients. Buttered syrups. Fats and Oils  Allowed: Vegetable oils: safflower, sunflower, corn, soybean, cottonseed, sesame, canola, olive, or peanut. Non-hydrogenated margarines. Salad dressing or mayonnaise: homemade or commercial, made with a recommended oil. Low or nonfat salad dressing or mayonnaise.  Limit added fats and oils to 6 to 8 tsp per day (includes fats used in cooking, baking, salads, and spreads on bread). Remember to count the "hidden fats" in foods.  Avoid: Solid fats and shortenings: butter, lard, salt pork, bacon drippings. Gravy containing meat fat, shortening, or suet. Cocoa butter, coconut. Coconut oil, palm oil, palm kernel oil, or hydrogenated oils: these ingredients are often used in bakery products, nondairy creamers, whipped toppings, candy, and commercially fried foods. Read labels carefully. Salad dressings made of unknown oils, sour cream, or cheese, such as blue cheese and Roquefort. Cream, all kinds: half-and-half, light, heavy, or whipping. Sour cream or cream cheese (even if "light" or low-fat). Nondairy cream substitutes: coffee creamers and sour cream substitutes made with palm, palm kernel, hydrogenated oils, or coconut oil. Beverages  Allowed: Coffee (regular or decaffeinated), tea. Diet carbonated beverages, mineral water. Alcohol: Check with your caregiver. Moderation is recommended.  Avoid: Whole milk, regular sodas, and juice drinks with added sugar. Condiments  Allowed: All seasonings and condiments. Cocoa powder. "Cream" sauces made with recommended ingredients.  Avoid: Carob powder made with hydrogenated fats. SAMPLE MENU Breakfast   cup orange juice   cup oatmeal  1 slice toast  1 tsp margarine  1 cup skim milk Lunch  [place]  sandwich with 2 oz [place], 2 slices bread  Lettuce and tomato slices  Fresh fruit  Carrot sticks  Coffee or tea Snack  Fresh fruit or low-fat crackers Dinner  3 oz lean ground beef  1 baked potato  1 tsp margarine   cup asparagus  Lettuce salad  1 tbs non-creamy dressing   cup peach slices  1 cup skim milk Document Released: 06/16/2008 Document Revised: 03/08/2012 Document Reviewed: 11/07/2013 Exit[employer] Patient Information 2015 [place], [place]. This information is not intended to replace advice given to you by your health care provider. Make sure you discuss any questions you have with your health care provider.

## 2015-05-20 NOTE — Progress Notes (Signed)
PATIENT: Tommy [name] MRN: [id number] DOB: 1951/01/14 PCP: Dwan Bolt, MD  Clinic Note: Chief Complaint  Patient presents with  . Follow-up    Patient has had swelling in his ankles. Hospital followup --   . Atrial Fibrillation    HPI: Tommy [name] is a 64 y.o. male with a PMH below who presents today for post-hospital follow-up for new onset paroxysmal atrial fibrillation.   He presented to the [facility] emergency room on April 02, 2015 with tachypalpitations and was found to be in atrial flutter/fibrillation. The initial plan was rate control would discharge home, however he became symptomatic and tachycardic with a failed with standing. He very orthopneic and orthostatic. He was therefore seen by me in consultation. We continued him on rate control with calcium blocker beta blocker and with plans to potentially proceed with cardioversion following day.  He spontaneously converted later that evening to normal sinus rhythm and was discharged the following day.  He was started on ELIQUIS prior to discharge. Inpatient studies reviewed:  Echocardiogram July 13: EF 55 and 60%. No regional WMA. Mild MR. Mildly dilated ascending aorta (4.5 mm in the aortic root). Mild RA dilation.  He was seen by Roderic Palau, [name]. At the atrial fibrillation clinic on July 25: He has not had any sustained irregular rapid heartbeats but has noted some palpitations. He was concerned about cost of Eliquis, and has been cutting his dose in half. He also indicated that he had swelling in his right ankle while on full dose listed improved with half dose. He is also very concerned about the new medications and this is his usual course of action.   Interval History: Since seeing [name], he has continued to note having intermittent rapid fluttering sensations in his chest while on 75 mg twice a day metoprolol. He therefore is twisted to "3/4 tablet TID" and has noted improvement in his palpitations. When he  feels about this he becomes anxious which was the case in the hospital. He is overall very anxious gentleman. He has not had any further swelling in the right ankle since cutting back on his dose of Eliquis..  The remainder of cardiac review of systems is as follows: Cardiovascular ROS: no chest pain or dyspnea on exertion positive for - irregular heartbeat, palpitations and rapid heart rate negative for - chest pain, dyspnea on exertion, orthopnea, paroxysmal nocturnal dyspnea, shortness of breath or No symptoms consistent with his AF. No TIA/amaurosis fugax, syncope/near syncope :no melena, hematochezia, hematuria or epistaxis.   Past Medical History  Diagnosis Date  . Hypertension   . Palpitations   . Bone tumor     BONE TUMORS LEFT UPPER ARM, ABOVE LEFT KNEE AND MAYBE IN JAWS --TOLD BENIGN   . Heart murmur     TOLD HE HAS A SLIGHT HEART MURMUR  . Anxiety   . Depression   . Asthma     ASTHMA AS A CHILD - NO PROBLEMS NOW  . GERD (gastroesophageal reflux disease)     NOT A RECENT PROBLEM - BUT DID TAKE OMEPRAZOLE COUPLE OF MONTHS AGO   . Headache(784.0)     MIGRAINES  . Cancer     PROSTATE CANCER  . PONV (postoperative nausea and vomiting)   . Pain     HX OF LOWER BACK PAIN AND RUPTURED DISK 2001 -BUT NO SURGERY  . Atrial flutter with controlled response 04/02/2015  . Paroxysmal atrial fibrillation     Prior Cardiac Evaluation and Past  Surgical History: Past Surgical History  Procedure Laterality Date  . Bone tumor excision  1973    LEFT LEG  . Hernia repair      RIGHT ING 1968 AND LEFT ING 1994 WITH MESH  . Benign skin tumor excised left armpit  ? 1972    . Robot assisted laparoscopic radical prostatectomy N/A 04/13/2013    Procedure: ROBOTIC ASSISTED LAPAROSCOPIC RADICAL PROSTATECTOMY LEVEL 3;  Surgeon: Dutch Gray, MD;  Location: [facility];  Service: Urology;  Laterality: N/A;  . Transthoracic echocardiogram  04/03/2015    EF 55 and 60%. No regional WMA. Mild MR. Mildly  dilated ascending aorta (4.5 mm in the aortic root). Mild RA dilation.    Allergies  Allergen Reactions  . Ibuprofen     Sleepwalking  . Paxil [Paroxetine Hcl] Other (See Comments)    Droopy face/disoriented.  Ebbie Ridge [Pseudoephedrine]     Causes a "rebound headache"     Current Outpatient Prescriptions  Medication Sig Dispense Refill  . acetaminophen (TYLENOL) 325 MG tablet Take 2 tablets (650 mg total) by mouth every 4 (four) hours as needed for headache or mild pain.    Marland Kitchen apixaban (ELIQUIS) 5 MG TABS tablet Take 1 tablet (5 mg total) by mouth 2 (two) times daily. 60 tablet 11  . clorazepate (TRANXENE) 3.75 MG tablet Take 3.75 mg by mouth 2 (two) times a week.     . clotrimazole (LOTRIMIN) 1 % cream Apply 1 application topically 2 (two) times daily.    Marland Kitchen latanoprost (XALATAN) 0.005 % ophthalmic solution Place 1 drop into both eyes at bedtime. Alternates 1 drop in each eye daily    . Metoprolol Tartrate 75 MG TABS Take 75 mg by mouth 2 (two) times daily. 60 tablet 11  . nystatin ointment (MYCOSTATIN) Apply 1 application topically every morning.  0  . omeprazole (PRILOSEC OTC) 20 MG tablet Take 10 mg by mouth daily as needed.     . polyethylene glycol powder (GLYCOLAX/MIRALAX) powder Take 1 Container by mouth once. Takes it a bit less    . sertraline (ZOLOFT) 50 MG tablet Take 50 mg by mouth daily.  0  . terbinafine (LAMISIL) 1 % cream Apply 1 application topically daily.    Marland Kitchen tolnaftate (TINACTIN) 1 % spray Apply 1 spray topically daily.    Marland Kitchen apixaban (ELIQUIS) 5 MG TABS tablet Take 1 tablet (5 mg total) by mouth 2 (two) times daily. 60 tablet 0   No current facility-administered medications for this visit.    Social History   Social History Narrative   Single. Lives with his brother Jenny Reichmann. Does retail work /bookkeeping at [employer]    family history includes Cancer in his father; Cirrhosis in his father; Healthy in his brother and sister; Heart disease in his mother; Heart  failure in his mother.  ROS: A comprehensive Review of Systems - was performed Review of Systems  Constitutional: Negative for malaise/fatigue.  Respiratory: Negative for cough and hemoptysis.   Cardiovascular: Negative for claudication.  Gastrointestinal: Negative for blood in stool and melena.  Genitourinary: Negative for hematuria.  Musculoskeletal: Positive for joint pain (Right ankle chronic pain).  Neurological: Positive for dizziness (When his heart rate goes up). Negative for headaches.  Endo/Heme/Allergies: Bruises/bleeds easily (if taking full dose Eliquis).  All other systems reviewed and are negative.   PHYSICAL EXAM BP 102/68 mmHg  Pulse 51  Ht 6\' 8"  (2.032 m)  Wt 199 lb (90.266 kg)  BMI 21.86 kg/m2 General appearance: alert,  cooperative, appears stated age, no distress and Very tall and thin (somewhat marfanoid in appearance). HEENT: [place]/AT, EOMI, MMM, anicteric sclera Neck: no adenopathy, no carotid bruit, no JVD and supple, symmetrical, trachea midline Lungs: clear to auscultation bilaterally, normal percussion bilaterally and Nonlabored. Good air movement Heart: Sinus bradycardia. Normal S1 and S2. No M./R./G. PMI nondisplaced. Abdomen: soft, non-tender; bowel sounds normal; no masses,  no organomegaly Extremities: extremities normal, atraumatic, no cyanosis or edema Pulses: 2+ and symmetric Neurologic: Mental status: Alert, oriented, thought content appropriate, affect: normal and Anxious Cranial nerves: normal   Adult ECG Report  Rate: 51 ;  Rhythm: sinus bradycardia and Otherwise normal axis, intervals, durations and voltage.   Narrative Interpretation: normal EKG other than sinus bradycardia  Recent Labs:    Chemistry      Component Value Date/Time   NA 139 04/03/2015 0408   K 3.8 04/03/2015 0408   CL 103 04/03/2015 0408   CO2 29 04/03/2015 0408   BUN 12 04/03/2015 0408   CREATININE 1.03 04/03/2015 0408      Component Value Date/Time   CALCIUM 8.9  04/03/2015 0408   ALKPHOS 38 04/02/2015 1644   AST 22 04/02/2015 1644   ALT 17 04/02/2015 1644   BILITOT 1.3* 04/02/2015 1644     Lab Results  Component Value Date   CHOL 178 04/03/2015   HDL 62 04/03/2015   LDLCALC 105* 04/03/2015   TRIG 57 04/03/2015   CHOLHDL 2.9 04/03/2015   Lab Results  Component Value Date   INR 1.20 04/02/2015    ASSESSMENT / PLAN: Very difficult communication with the patient. He has significant anxiety he reference to his newly found condition. He is also very anxious about taking her medications. We had a long talk about L. To doing a nuclear stress test, but he has declined.  > 40 minutes spent with patient.  Problem List Items Addressed This Visit    Anxiety    Certainly owing to difficulty managing his anticoagulation. Also difficult in getting him to undergo full evaluation. This is being monitored and managed by PCP.      Current use of long term anticoagulation (Chronic)    See discussion in Afib section.      Essential hypertension - Primary (Chronic)    Blood pressure was very well controlled on current beta blocker dose.      Relevant Medications   apixaban (ELIQUIS) 5 MG TABS tablet   Other Relevant Orders   EKG 12-Lead (Completed)   PAF (paroxysmal atrial fibrillation)    He is having what sounds like probable PACs or PVCs, no prolonged episodes. If this continues, we may need to use a monitor. He is doing better on that is equivocally 50 mg 3 times a day of metoprolol. I think the increased frequency without there being less intermittent down kind of medication dosing.  I don't feel that I can increase the dose any more due to his baseline sinus bradycardia. We may consider her flecainide for rhythm control if he has more recurrences. This would require him to undergo stress test to exclude coronary artery disease. At this point he is reluctant to undergo stress test..  CHADS2Vasc2 Score = 1 for HTN only -- I spent about 15 minutes  all going to be with calculations with him about anticoagulation versus aspirin. Treatment with out either option would give him a annual risk of stroke of roughly 1.5%. This is reduced roughly 1.2% by aspirin alone and down to roughly  0.5% with Warfarin or NOAC.  For now he is fine trying to use Brilinta, but if he does not have any recurrences, we could potentially safely consider aspirin alone if he is overly concerned.  I will as Tommy Medal, RPH to contact him in order to go over NOAC options & insurance coverage along with appropriate dosing.      Relevant Medications   apixaban (ELIQUIS) 5 MG TABS tablet      Meds ordered this encounter  Medications  . apixaban (ELIQUIS) 5 MG TABS tablet    Sig: Take 1 tablet (5 mg total) by mouth 2 (two) times daily.    Dispense:  60 tablet    Refill:  0   CONTINUE WITH TAKING 3/4 of 75 mg of Metoprolol three times  A day  Continue with ELIQUIS -- 30 day free card & $10 co-pay card given.  Recommended going back to full 5 mg dose..  Your physician wants you to follow-up in DEC/JAN 2016/17 WITH DR Shamela Haydon.   Jermiah Howton W. Ellyn Hack, M.D., M.S. Interventional Cardiolgy [facility]

## 2015-05-21 ENCOUNTER — Encounter: Payer: Self-pay | Admitting: Cardiology

## 2015-05-21 DIAGNOSIS — Z7901 Long term (current) use of anticoagulants: Secondary | ICD-10-CM | POA: Insufficient documentation

## 2015-05-21 NOTE — Assessment & Plan Note (Signed)
Certainly owing to difficulty managing his anticoagulation. Also difficult in getting him to undergo full evaluation. This is being monitored and managed by PCP.

## 2015-05-21 NOTE — Assessment & Plan Note (Signed)
See discussion in Afib section.

## 2015-05-21 NOTE — Assessment & Plan Note (Signed)
Blood pressure was very well controlled on current beta blocker dose.

## 2015-05-21 NOTE — Assessment & Plan Note (Addendum)
He is having what sounds like probable PACs or PVCs, no prolonged episodes. If this continues, we may need to use a monitor. He is doing better on that is equivocally 50 mg 3 times a day of metoprolol. I think the increased frequency without there being less intermittent down kind of medication dosing.  I don't feel that I can increase the dose any more due to his baseline sinus bradycardia. We may consider her flecainide for rhythm control if he has more recurrences. This would require him to undergo stress test to exclude coronary artery disease. At this point he is reluctant to undergo stress test..  CHADS2Vasc2 Score = 1 for HTN only -- I spent about 15 minutes all going to be with calculations with him about anticoagulation versus aspirin. Treatment with out either option would give him a annual risk of stroke of roughly 1.5%. This is reduced roughly 1.2% by aspirin alone and down to roughly 0.5% with Warfarin or NOAC.  For now he is fine trying to use Brilinta, but if he does not have any recurrences, we could potentially safely consider aspirin alone if he is overly concerned.  I will as Tommy Medal, RPH to contact him in order to go over NOAC options & insurance coverage along with appropriate dosing.

## 2015-07-04 NOTE — Telephone Encounter (Signed)
error 

## 2015-09-05 ENCOUNTER — Ambulatory Visit: Payer: Self-pay | Admitting: Cardiology

## 2015-10-09 ENCOUNTER — Ambulatory Visit (INDEPENDENT_AMBULATORY_CARE_PROVIDER_SITE_OTHER): Payer: BLUE CROSS/BLUE SHIELD | Admitting: Cardiology

## 2015-10-09 ENCOUNTER — Encounter: Payer: Self-pay | Admitting: Cardiology

## 2015-10-09 VITALS — BP 92/60 | HR 62 | Ht >= 80 in | Wt 203.0 lb

## 2015-10-09 DIAGNOSIS — Z7901 Long term (current) use of anticoagulants: Secondary | ICD-10-CM | POA: Diagnosis not present

## 2015-10-09 DIAGNOSIS — F419 Anxiety disorder, unspecified: Secondary | ICD-10-CM

## 2015-10-09 DIAGNOSIS — I10 Essential (primary) hypertension: Secondary | ICD-10-CM | POA: Diagnosis not present

## 2015-10-09 DIAGNOSIS — I480 Paroxysmal atrial fibrillation: Secondary | ICD-10-CM | POA: Diagnosis not present

## 2015-10-09 NOTE — Progress Notes (Addendum)
PATIENT: Tommy [name] MRN: [id number] DOB: May 03, 1951 PCP: Dwan Bolt, MD  Clinic Note: Chief Complaint  Patient presents with  . Shortness of Breath    at night  . Atrial Fibrillation    HPI: Tommy [name] is a 65 y.o. male with a PMH below who presents today for post-hospital follow-up for new onset paroxysmal atrial fibrillation (in some occasions atrial flutter).  This was initially diagnosed in July 2016. After plans that were initially for rate control and discharge home, he became very symptomatic. He had significant orthostasis and dyspnea. He was therefore admitted for rate control and cardioversion. Thankfully, he is potentially converted to sinus rhythm prior to cardioversion. After much discussion, he was started on ELIQUIS prior to discharge.   He was seen by Tommy Palau, [name]. At the atrial fibrillation clinic on July 25: He has not had any sustained irregular rapid heartbeats but has noted some palpitations. He was concerned about cost of Eliquis, and has been cutting his dose in half. He also indicated that he had swelling in his right ankle while on full dose listed improved with half dose. He is also very concerned about the new medications and this is his usual course of action.   I saw him in August. We had a very long talk. There was concerns about his ELIQUIS dosing. Thankfully, he was in sinus rhythm. The plan was to simply use beta blocker which he has adjusted dosing of for rate control.   Interval History: Since I last saw him, he actually has been doing relatively well. He doesn't note that much the way of any sustained rapid rates. He has had some occasional fleeting palpitations, but nothing lasting more than a minute. He has adjusted his metoprolol/that he takes three quarters of a tablet of 75 mg in the morning and evening and 75 in mid day. With that, he has been feeling better. He notes that if he takes the higher doses in the morning and evening he  feels strange and has some shortness of breath. By cutting the dose down little bit he feels better.  He really hasn't had any bleeding issues with ELIQUIS until he dropped something on his foot at approximately significant bruise. He reduced his ELIQUIS to just once daily at that time and the bruising got better.  ince seeing [name], he has continued to note having intermittent rapid fluttering sensations in his chest while on 75 mg twice a day metoprolol. He therefore is twisted to "3/4 tablet TID" and has noted improvement in his palpitations. When he feels about this he becomes anxious which was the case in the hospital. He is overall very anxious gentleman. He has not had any further swelling in the right ankle since cutting back on his dose of Eliquis..  The remainder of cardiac review of systems is as follows: Cardiovascular ROS: no chest pain or dyspnea on exertion positive for - irregular heartbeat and Shortness of breath associated with metoprolol dosing. Also notable anxiety. negative for - chest pain, dyspnea on exertion, edema, irregular heartbeat, loss of consciousness, murmur, orthopnea, paroxysmal nocturnal dyspnea, rapid heart rate, shortness of breath or No symptoms consistent with his AF. No TIA/amaurosis fugax, syncope/near syncope :no melena, hematochezia, hematuria or epistaxis.   Past Medical History  Diagnosis Date  . Hypertension   . Palpitations   . Bone tumor     BONE TUMORS LEFT UPPER ARM, ABOVE LEFT KNEE AND MAYBE IN JAWS --TOLD BENIGN   .  Heart murmur     TOLD HE HAS A SLIGHT HEART MURMUR  . Anxiety   . Depression   . Asthma     ASTHMA AS A CHILD - NO PROBLEMS NOW  . GERD (gastroesophageal reflux disease)     NOT A RECENT PROBLEM - BUT DID TAKE OMEPRAZOLE COUPLE OF MONTHS AGO   . Headache(784.0)     MIGRAINES  . Cancer [facility])     PROSTATE CANCER  . PONV (postoperative nausea and vomiting)   . Pain     HX OF LOWER BACK PAIN AND RUPTURED DISK 2001 -BUT NO  SURGERY  . Atrial flutter with controlled response ([place]) 04/02/2015  . Paroxysmal atrial fibrillation [facility])     Prior Cardiac Evaluation and Past Surgical History: Past Surgical History  Procedure Laterality Date  . Bone tumor excision  1973    LEFT LEG  . Hernia repair      RIGHT ING 1968 AND LEFT ING 1994 WITH MESH  . Benign skin tumor excised left armpit  ? 1972    . Robot assisted laparoscopic radical prostatectomy N/A 04/13/2013    Procedure: ROBOTIC ASSISTED LAPAROSCOPIC RADICAL PROSTATECTOMY LEVEL 3;  Surgeon: Dutch Gray, MD;  Location: [facility];  Service: Urology;  Laterality: N/A;  . Transthoracic echocardiogram  04/03/2015    EF 55 and 60%. No regional WMA. Mild MR. Mildly dilated ascending aorta (4.5 mm in the aortic root). Mild RA dilation.    Allergies  Allergen Reactions  . Ibuprofen     Sleepwalking  . Paxil [Paroxetine Hcl] Other (See Comments)    Droopy face/disoriented.  Ebbie Ridge [Pseudoephedrine]     Causes a "rebound headache"     Current Outpatient Prescriptions  Medication Sig Dispense Refill  . acetaminophen (TYLENOL) 325 MG tablet Take 2 tablets (650 mg total) by mouth every 4 (four) hours as needed for headache or mild pain.    Marland Kitchen apixaban (ELIQUIS) 5 MG TABS tablet Take 1 tablet (5 mg total) by mouth 2 (two) times daily. 60 tablet 11  . apixaban (ELIQUIS) 5 MG TABS tablet Take 1 tablet (5 mg total) by mouth 2 (two) times daily. 60 tablet 0  . clorazepate (TRANXENE) 3.75 MG tablet Take 3.75 mg by mouth 2 (two) times a week.     . clotrimazole (LOTRIMIN) 1 % cream Apply 1 application topically 2 (two) times daily.    Marland Kitchen latanoprost (XALATAN) 0.005 % ophthalmic solution Place 1 drop into both eyes at bedtime. Alternates 1 drop in each eye daily    . Metoprolol Tartrate 75 MG TABS Take 75 mg by mouth 2 (two) times daily. 60 tablet 11  . nystatin ointment (MYCOSTATIN) Apply 1 application topically every morning.  0  . omeprazole (PRILOSEC OTC) 20 MG tablet Take  10 mg by mouth daily as needed.     . polyethylene glycol powder (GLYCOLAX/MIRALAX) powder Take 1 Container by mouth once. Takes it a bit less    . sertraline (ZOLOFT) 50 MG tablet Take 50 mg by mouth daily.  0  . terbinafine (LAMISIL) 1 % cream Apply 1 application topically daily.    Marland Kitchen tolnaftate (TINACTIN) 1 % spray Apply 1 spray topically daily.     No current facility-administered medications for this visit.    Social History   Social History Narrative   Single. Lives with his brother Jenny Reichmann. Does retail work /bookkeeping at [employer]    family history includes Cancer in his father; Cirrhosis in his father; Healthy in  his brother and sister; Heart disease in his mother; Heart failure in his mother.  ROS: A comprehensive Review of Systems - was performed Review of Systems  Constitutional: Negative for malaise/fatigue.  Eyes: Negative for blurred vision.  Respiratory: Negative for cough, hemoptysis and shortness of breath (strangely, this is associated with beta blocker dosing in the morning and evening).   Cardiovascular: Negative for claudication.  Gastrointestinal: Negative for blood in stool and melena.  Genitourinary: Negative for hematuria.  Musculoskeletal: Positive for joint pain (Left great toe bruising and swelling after an injury ).  Neurological: Positive for dizziness (When his heart rate goes up). Negative for headaches.  Endo/Heme/Allergies: Bruises/bleeds easily (if taking full dose Eliquis).  All other systems reviewed and are negative.   PHYSICAL EXAM BP 92/60 mmHg  Pulse 62  Ht 6\' 8"  (2.032 m)  Wt 203 lb (92.08 kg)  BMI 22.30 kg/m2 General appearance: alert, cooperative, appears stated age, no distress and Very tall and thin (somewhat marfanoid in appearance). HEENT: [place]/AT, EOMI, MMM, anicteric sclera Neck: no adenopathy, no carotid bruit, no JVD and supple, symmetrical, trachea midline Lungs: clear to auscultation bilaterally, normal percussion bilaterally  and Nonlabored. Good air movement Heart: Sinus bradycardia. Normal S1 and S2. No M./R./G. PMI nondisplaced. Abdomen: soft, non-tender; bowel sounds normal; no masses,  no organomegaly Extremities: extremities normal, atraumatic, no cyanosis or edema Pulses: 2+ and symmetric Neurologic: Mental status: Alert, oriented, thought content appropriate, affect: normal and Anxious Cranial nerves: normal   Adult ECG Report  Rate: 62;  Rhythm: normal sinus rhythm, premature atrial contractions (PAC), premature ventricular contractions (PVC) and Possible apparent conduction. Otherwise normal axis, intervals, durations and voltage.   Narrative Interpretation: Heart rate is low faster; PACs and PVCs now present.  Recent Labs:    Chemistry      Component Value Date/Time   NA 139 04/03/2015 0408   K 3.8 04/03/2015 0408   CL 103 04/03/2015 0408   CO2 29 04/03/2015 0408   BUN 12 04/03/2015 0408   CREATININE 1.03 04/03/2015 0408      Component Value Date/Time   CALCIUM 8.9 04/03/2015 0408   ALKPHOS 38 04/02/2015 1644   AST 22 04/02/2015 1644   ALT 17 04/02/2015 1644   BILITOT 1.3* 04/02/2015 1644     Lab Results  Component Value Date   CHOL 178 04/03/2015   HDL 62 04/03/2015   LDLCALC 105* 04/03/2015   TRIG 57 04/03/2015   CHOLHDL 2.9 04/03/2015   Lab Results  Component Value Date   INR 1.20 04/02/2015    ASSESSMENT / PLAN: Very difficult communication with the patient. He has significant anxiety he reference to his newly found condition. He is also very anxious about taking her medications.  again, he declined going for a nuclear stress test, but he has declined.  > 25 minutes spent with patient.  Problem List Items Addressed This Visit    PAF (paroxysmal atrial fibrillation) ([place])    Definitely remains symptomatic but has A. Fib. He currently doing essentially 75 mg 3 times a day metoprolol Besides the fact that his blood pressures little low, he is rate seems well controlled and he  has not had any recurrence of symptoms.  We again had a discussion about pros and cons of anticoagulation versus aspirin. At this point he is calm with taking ELIQUIS. He really isn't always taking it according to plan, but for the most part is taking it full dose twice a day.  At this point  I'm reluctant to try pill in the pocket medications as flecainide because we have not done an ischemic evaluation. He still does not want a stress test.      Relevant Orders   EKG 12-Lead   Essential hypertension - Primary (Chronic)    Actually borderline hypotensive with current dose of beta blocker, but not overly symptomatic.      Relevant Orders   EKG 12-Lead   Current use of long term anticoagulation (Chronic)    See discussion in A. fib section      Anxiety (Chronic)    I think this deathly could contribute to recurrence of A. fib. He is now on Zoloft. Will defer to PCP.         No orders of the defined types were placed in this encounter.   NO CHANGE IN CURRENT MEDICATIONS  Your physician wants you to follow-up in JULY 2017 [place].     Leonie Man, M.D., M.S. Interventional Cardiologist   Pager # [phone] Phone # [phone] [address] [place], [place] [address]

## 2015-10-09 NOTE — Patient Instructions (Signed)
NO CHANGE IN CURRENT MEDICATIONS  Your physician wants you to follow-up in JULY 2017 [place]. You will receive a reminder letter in the mail two months in advance. If you don't receive a letter, please call our office to schedule the follow-up appointment.   If you need a refill on your cardiac medications before your next appointment, please call your pharmacy.

## 2015-10-12 ENCOUNTER — Encounter: Payer: Self-pay | Admitting: Cardiology

## 2015-10-12 NOTE — Assessment & Plan Note (Signed)
I think this deathly could contribute to recurrence of A. fib. He is now on Zoloft. Will defer to PCP.

## 2015-10-12 NOTE — Assessment & Plan Note (Signed)
See discussion in Afib section. 

## 2015-10-12 NOTE — Assessment & Plan Note (Signed)
Actually borderline hypotensive with current dose of beta blocker, but not overly symptomatic.

## 2015-10-12 NOTE — Assessment & Plan Note (Signed)
Definitely remains symptomatic but has A. Fib. He currently doing essentially 75 mg 3 times a day metoprolol Besides the fact that his blood pressures little low, he is rate seems well controlled and he has not had any recurrence of symptoms.  We again had a discussion about pros and cons of anticoagulation versus aspirin. At this point he is calm with taking ELIQUIS. He really isn't always taking it according to plan, but for the most part is taking it full dose twice a day.  At this point I'm reluctant to try pill in the pocket medications as flecainide because we have not done an ischemic evaluation. He still does not want a stress test.

## 2015-11-20 ENCOUNTER — Telehealth: Payer: Self-pay

## 2015-11-20 MED ORDER — METOPROLOL TARTRATE 75 MG PO TABS: 75.0000 mg | ORAL_TABLET | Freq: Three times a day (TID) | ORAL | Status: DC

## 2015-11-20 NOTE — Telephone Encounter (Signed)
Pt walked in office today with questions about his medications. He needs to take his Metoprolol 75 mg TID and pt RX only written to take BID.  Reviewed with Dr. Ellyn Hack and approved orders for pt to take medication as he has been taking it Order updated to preferred pharmacy, pt notified of medication updated rx.

## 2016-06-10 ENCOUNTER — Other Ambulatory Visit: Payer: Self-pay | Admitting: Cardiology

## 2016-07-15 ENCOUNTER — Encounter: Payer: Self-pay | Admitting: Cardiology

## 2016-07-15 ENCOUNTER — Ambulatory Visit (INDEPENDENT_AMBULATORY_CARE_PROVIDER_SITE_OTHER): Payer: BLUE CROSS/BLUE SHIELD | Admitting: Cardiology

## 2016-07-15 VITALS — BP 112/68 | HR 56 | Ht >= 80 in | Wt 206.4 lb

## 2016-07-15 DIAGNOSIS — Z7901 Long term (current) use of anticoagulants: Secondary | ICD-10-CM | POA: Diagnosis not present

## 2016-07-15 DIAGNOSIS — I878 Other specified disorders of veins: Secondary | ICD-10-CM | POA: Diagnosis not present

## 2016-07-15 DIAGNOSIS — I10 Essential (primary) hypertension: Secondary | ICD-10-CM

## 2016-07-15 DIAGNOSIS — I480 Paroxysmal atrial fibrillation: Secondary | ICD-10-CM | POA: Diagnosis not present

## 2016-07-15 NOTE — Patient Instructions (Signed)
TAKE ELIQUIS  TWICE A DAY  METOPROLOL 75 MG THREE TIMES A DAY   PURCHASE KNEE HI SUPPORT HOSE /SOCKS/STOCKING DEPARTMENT  STORE, PHARMACY OR MEDICAL SUPPLY  Your physician wants you to follow-up in: 56 MONTHS WITH DR HARDING 30. You will receive a reminder letter in the mail two months in advance. If you don't receive a letter, please call our office to schedule the follow-up appointment.   If you need a refill on your cardiac medications before your next appointment, please call your pharmacy.

## 2016-07-16 ENCOUNTER — Encounter: Payer: Self-pay | Admitting: Cardiology

## 2016-07-16 NOTE — Assessment & Plan Note (Signed)
He is quite tall, and does have some varicose veins with some tenderness and mild swelling. I recommended that he wears support stockings. Would try to avoid diuretic at this time.

## 2016-07-16 NOTE — Assessment & Plan Note (Signed)
Well-controlled on current dose of beta-blocker. 

## 2016-07-16 NOTE — Progress Notes (Signed)
PCP: Dwan Bolt, MD  Clinic Note: Chief Complaint  Patient presents with  . Atrial Fibrillation    HPI: Tommy [name] is a 65 y.o. male with a PMH below who presents today for delayed six-month follow-up for her occipital atrial fibrillation. -- I initially had an followed by the A. fib clinic with Roderic Palau, NP. Thankfully he has not had any recurrence of sustained irregular heartbeats. He is on beta blocker for rate control and that he is adjusted the dose of, and takes one half of Eliquis tablet daily. -- He has been pretty consistent with his desire to avoid stress testing. I therefore am not used pill in the pocket treatment such as flecainide or propafenone.  Tommy [name] was last seen in January - he told me that he was taking 75 mg 3 times a day metoprolol. He also noted he was swelling with full dose ELIQUIS, so he reduced his dose by half.  Recent Hospitalizations: None  Studies Reviewed: None  Interval History: Tommy [name] presents today doing fairly well. He says he really hasn't had any prolonged rapid irregular heartbeats. He does have some short spells lasting a few seconds and may be a little bit symptomatic in the last close to 10 seconds. But nothing is lasted in the in the greater than 5-10 minutes range. These episodes are often times associated with when he is stressed or excited.Marland Kitchen He is pretty active and can do most things, but when he's done things like trying to push lawnmower or push a cart with his upper body note some chest tightness and pressure associated with it. It goes away with rest. But otherwise doing routine activity that is pretty exertional, he does not have the symptom. No resting chest pain.  No shortness of breath with rest or exertion.  No PND, orthopnea with mild lower extremity edema and some swollen varicose veins.   No lightheadedness, dizziness, weakness or syncope/near syncope. No TIA/amaurosis fugax symptoms. No melena,  hematochezia, hematuria, or epstaxis. No claudication.  ROS: A comprehensive was performed. Review of Systems  Constitutional: Negative for malaise/fatigue.  HENT: Negative for nosebleeds.   Eyes: Negative.   Cardiovascular: Positive for leg swelling (Mild leg swelling with varicose veins). Negative for claudication.  Gastrointestinal: Negative for blood in stool and melena.  Genitourinary: Negative for hematuria.  Musculoskeletal: Positive for myalgias (Associated with leg swelling). Negative for joint pain.  Neurological: Negative for dizziness and headaches.  Endo/Heme/Allergies: Negative for environmental allergies. Does not bruise/bleed easily.  Psychiatric/Behavioral: Negative for depression and memory loss. The patient is nervous/anxious. The patient does not have insomnia.   All other systems reviewed and are negative.   Past Medical History:  Diagnosis Date  . Anxiety   . Asthma    ASTHMA AS A CHILD - NO PROBLEMS NOW  . Atrial flutter with controlled response ([employer]) 04/02/2015  . Bone tumor    BONE TUMORS LEFT UPPER ARM, ABOVE LEFT KNEE AND MAYBE IN JAWS --TOLD BENIGN   . Cancer [facility])    PROSTATE CANCER  . Depression   . GERD (gastroesophageal reflux disease)    NOT A RECENT PROBLEM - BUT DID TAKE OMEPRAZOLE COUPLE OF MONTHS AGO   . Headache(784.0)    MIGRAINES  . Heart murmur    TOLD HE HAS A SLIGHT HEART MURMUR  . Hypertension   . Pain    HX OF LOWER BACK PAIN AND RUPTURED DISK 2001 -BUT NO SURGERY  . Palpitations   .  Paroxysmal atrial fibrillation ([facility])   . PONV (postoperative nausea and vomiting)     Past Surgical History:  Procedure Laterality Date  . BENIGN SKIN TUMOR EXCISED LEFT ARMPIT  ? 1972    . BONE TUMOR EXCISION  1973   LEFT LEG  . HERNIA REPAIR     RIGHT ING 1968 AND LEFT ING 1994 WITH MESH  . ROBOT ASSISTED LAPAROSCOPIC RADICAL PROSTATECTOMY N/A 04/13/2013   Procedure: ROBOTIC ASSISTED LAPAROSCOPIC RADICAL PROSTATECTOMY LEVEL 3;  Surgeon: Dutch Gray, MD;  Location: [facility];  Service: Urology;  Laterality: N/A;  . TRANSTHORACIC ECHOCARDIOGRAM  04/03/2015   EF 55 and 60%. No regional WMA. Mild MR. Mildly dilated ascending aorta (4.5 mm in the aortic root). Mild RA dilation.   Prior to Admission medications   Medication Sig Start Date End Date Taking? Authorizing Provider  acetaminophen (TYLENOL) 325 MG tablet Take 2 tablets (650 mg total) by mouth every 4 (four) hours as needed for headache or mild pain. 04/03/15  Yes Erlene Quan, PA-C  apixaban (ELIQUIS) 5 MG TABS tablet Take 1 tablet (5 mg total) by mouth 2 (two) times daily. 04/15/15 Taking 1/2 tab daily Yes Sherran Needs, NP  clorazepate (TRANXENE) 3.75 MG tablet Take 3.75 mg by mouth 2 (two) times a week.    Yes Historical Provider, MD  clotrimazole (LOTRIMIN) 1 % cream Apply 1 application topically 2 (two) times daily.   Yes Historical Provider, MD  latanoprost (XALATAN) 0.005 % ophthalmic solution Place 1 drop into both eyes at bedtime. Alternates 1 drop in each eye daily   Yes Historical Provider, MD  metoprolol (LOPRESSOR) 50 MG tablet   take 1 and 1/2 tablets by mouth three times a day 06/10/16 Only if 75 mg tabs not available No  Leonie Man, MD  Metoprolol Tartrate 75 MG TABS Take 75 mg by mouth 3 (three) times daily. 11/20/15  Yes Leonie Man, MD  nystatin ointment (MYCOSTATIN) Apply 1 application topically every morning. 01/24/15  Yes Historical Provider, MD  omeprazole (PRILOSEC OTC) 20 MG tablet Take 10 mg by mouth daily as needed.    Yes Historical Provider, MD  polyethylene glycol powder (GLYCOLAX/MIRALAX) powder Take 1 Container by mouth once. Takes it a bit less   Yes Historical Provider, MD  sertraline (ZOLOFT) 50 MG tablet Take 50 mg by mouth daily. 03/19/15  Yes Historical Provider, MD  terbinafine (LAMISIL) 1 % cream Apply 1 application topically daily.   Yes Historical Provider, MD  tolnaftate (TINACTIN) 1 % spray Apply 1 spray topically daily.   Yes Historical  Provider, MD   Allergies  Allergen Reactions  . Ibuprofen     Sleepwalking  . Paxil [Paroxetine Hcl] Other (See Comments)    Droopy face/disoriented.  Ebbie Ridge [Pseudoephedrine]     Causes a "rebound headache"     Social History   Social History  . Marital status: Single    Spouse name: N/A  . Number of children: N/A  . Years of education: N/A   Social History Main Topics  . Smoking status: Former Smoker    Types: Cigarettes, Pipe, Cigars    Quit date: 09/21/1970  . Smokeless tobacco: Never Used  . Alcohol use 0.6 oz/week    1 Glasses of wine per week     Comment: Occassionally   . Drug use: No  . Sexual activity: Not Asked   Other Topics Concern  . None   Social History Narrative   Single. Lives  with his brother Jenny Reichmann. Does retail work /bookkeeping at [employer]   family history includes Cancer in his father; Cirrhosis in his father; Healthy in his brother and sister; Heart disease in his mother; Heart failure in his mother.  Wt Readings from Last 3 Encounters:  07/15/16 93.6 kg (206 lb 6.4 oz)  10/09/15 92.1 kg (203 lb)  05/20/15 90.3 kg (199 lb)    PHYSICAL EXAM BP 112/68   Pulse (!) 56   Ht 6\' 8"  (2.032 m)   Wt 93.6 kg (206 lb 6.4 oz)   BMI 22.67 kg/m  General appearance: alert, cooperative, appears stated age, no distress and Very tall and thin (somewhat marfanoid in appearance). HEENT: /AT, EOMI, MMM, anicteric sclera Neck: no adenopathy, no carotid bruit, no JVD and supple, symmetrical, trachea midline Lungs: clear to auscultation bilaterally, normal percussion bilaterally and Nonlabored. Good air movement Heart: Sinus bradycardia. Normal S1 and S2. No M./R./G. PMI nondisplaced. Abdomen: soft, non-tender; bowel sounds normal; no masses,  no organomegaly Extremities: extremities normal, atraumatic, no cyanosis - trace ankle edema but with notable swollen varicose veins left greater than right lower leg. Pulses: 2+ and symmetric Neurologic: Mental  status: Alert, oriented, thought content appropriate, affect: normal and Anxious Cranial nerves: normal    Adult ECG Report  Rate: 56 ;  Rhythm: sinus bradycardia and Incomplete RBBB. No PACs or PVCs. Normal axis, intervals and durations.;   Narrative Interpretation: Stable EKG.   Other studies Reviewed: Additional studies/ records that were reviewed today include:  Recent Labs:  n/a  ASSESSMENT / PLAN: Problem List Items Addressed This Visit    Venous stasis of both lower extremities (Chronic)    He is quite tall, and does have some varicose veins with some tenderness and mild swelling. I recommended that he wears support stockings. Would try to avoid diuretic at this time.      Relevant Orders   EKG 12-Lead   PAF (paroxysmal atrial fibrillation) ([place]); CHA2DS2-VASc Score 1; ~ on Eliquis - Primary (Chronic)    As far as I can tell, no recurrence since his hospital stay. He was quite symptomatic when he was in A. fib so I suspect he is not had any notable recurrence. Currently he is taking 75 mg 2 out of the 3 doses of the day and the second half a tablet (37.5 mg) for the evening dose. Basically, if he takes a full dose he is very groggy when sleeping. He is worried that maybe his heart rate is going too slow. -- We have opted not to use an antiarrhythmic even as a when necessary dosing - mostly because he is not had an ischemic evaluation yet - by his choice  He is limiting his Eliquis one half tablet once a day. I recommended he at least take it twice a day in order to make it worthwhile.  If he doesn't have any more recurrence in the next year so, we may want to consider whether or not he stays on Eliquis were discussed aspirin.      Relevant Orders   EKG 12-Lead   Essential hypertension (Chronic)    Well-controlled on current dose of beta blocker.      Relevant Orders   EKG 12-Lead   Current use of long term anticoagulation (Chronic)   Relevant Orders   EKG 12-Lead      Other Visit Diagnoses   None.     Current medicines are reviewed at length with the patient today. (+/- concerns) ?  Dosing of Eliquis The following changes have been made:   Patient Instructions  TAKE ELIQUIS  TWICE A DAY  METOPROLOL 75 MG THREE TIMES A DAY   PURCHASE KNEE HI SUPPORT HOSE /SOCKS/STOCKING DEPARTMENT  STORE, Industry  Your physician wants you to follow-up in: 34 MONTHS WITH DR HARDING 30. You will receive a reminder letter in the mail two months in advance. If you don't receive a letter, please call our office to schedule the follow-up appointment.   If you need a refill on your cardiac medications before your next appointment, please call your pharmacy.    Studies Ordered:   Orders Placed This Encounter  Procedures  . EKG 12-Lead      Tommy [name], M.D., M.S. Interventional Cardiologist   Pager # [phone] Phone # [phone] [address] [place], [place] [address]

## 2016-07-16 NOTE — Assessment & Plan Note (Addendum)
As far as I can tell, no recurrence since his hospital stay. He was quite symptomatic when he was in A. fib so I suspect he is not had any notable recurrence. Currently he is taking 75 mg 2 out of the 3 doses of the day and the second half a tablet (37.5 mg) for the evening dose. Basically, if he takes a full dose he is very groggy when sleeping. He is worried that maybe his heart rate is going too slow. -- We have opted not to use an antiarrhythmic even as a when necessary dosing - mostly because he is not had an ischemic evaluation yet - by his choice  He is limiting his Eliquis one half tablet once a day. I recommended he at least take it twice a day in order to make it worthwhile.  If he doesn't have any more recurrence in the next year so, we may want to consider whether or not he stays on Eliquis were discussed aspirin.

## 2016-08-07 ENCOUNTER — Other Ambulatory Visit (HOSPITAL_COMMUNITY): Payer: Self-pay | Admitting: Nurse Practitioner

## 2016-12-23 ENCOUNTER — Other Ambulatory Visit: Payer: Self-pay | Admitting: Cardiology

## 2017-02-01 ENCOUNTER — Other Ambulatory Visit: Payer: Self-pay | Admitting: Cardiology

## 2017-02-01 MED ORDER — METOPROLOL TARTRATE 50 MG PO TABS: 75.0000 mg | ORAL_TABLET | Freq: Three times a day (TID) | ORAL | 1 refills | Status: DC

## 2017-02-01 NOTE — Telephone Encounter (Signed)
REFILL 

## 2017-02-01 NOTE — Telephone Encounter (Signed)
Rx(s) sent to pharmacy electronically.  

## 2017-02-01 NOTE — Telephone Encounter (Signed)
°*  STAT* If patient is at the pharmacy, call can be transferred to refill team.   1. Which medications need to be refilled? (please list name of each medication and dose if known) Metoprolol 2. Which pharmacy/location (including street and city if local pharmacy) is medication to be sent to?[phone]  3. Do they need a 30 day or 90 day supply? 90 and refills

## 2017-04-06 ENCOUNTER — Telehealth: Payer: Self-pay | Admitting: Cardiology

## 2017-04-06 NOTE — Telephone Encounter (Signed)
Left message for patient, too soon for new rx/new prescription.   Advised to call with questions or concerns.

## 2017-04-06 NOTE — Telephone Encounter (Signed)
New Message   *STAT* If patient is at the pharmacy, call can be transferred to refill team.   1. Which medications need to be refilled? (please list name of each medication and dose if known) Metoprolol 75mg    2. Which pharmacy/location (including street and city if local pharmacy) is medication to be sent to? [facility] [place]  3. Do they need a 30 day or 90 day supply? 90 day

## 2017-04-09 ENCOUNTER — Other Ambulatory Visit: Payer: Self-pay | Admitting: Cardiology

## 2017-07-29 ENCOUNTER — Ambulatory Visit (INDEPENDENT_AMBULATORY_CARE_PROVIDER_SITE_OTHER): Payer: BLUE CROSS/BLUE SHIELD | Admitting: Cardiology

## 2017-07-29 ENCOUNTER — Encounter: Payer: Self-pay | Admitting: Cardiology

## 2017-07-29 VITALS — BP 140/62 | HR 63 | Ht >= 80 in | Wt 203.0 lb

## 2017-07-29 DIAGNOSIS — Z7901 Long term (current) use of anticoagulants: Secondary | ICD-10-CM | POA: Diagnosis not present

## 2017-07-29 DIAGNOSIS — I480 Paroxysmal atrial fibrillation: Secondary | ICD-10-CM | POA: Diagnosis not present

## 2017-07-29 DIAGNOSIS — I878 Other specified disorders of veins: Secondary | ICD-10-CM | POA: Diagnosis not present

## 2017-07-29 DIAGNOSIS — I10 Essential (primary) hypertension: Secondary | ICD-10-CM | POA: Diagnosis not present

## 2017-07-29 NOTE — Patient Instructions (Signed)
No changes with current medications     Your physician wants you to follow-up in 12 months with DR HARDING. You will receive a reminder letter in the mail two months in advance. If you don't receive a letter, please call our office to schedule the follow-up appointment.   If you need a refill on your cardiac medications before your next appointment, please call your pharmacy.

## 2017-07-29 NOTE — Progress Notes (Addendum)
PCP: Tommy Gravel, MD  Clinic Note: No chief complaint on file.   HPI: Tommy [name] is a 66 y.o. male with a PMH below who presents today for annual follow-up for paroxysmal atrial fibrillation. He initially was seen by Tommy Palau NP in the A. fib clinic.  Placed on beta-blocker for rate control and Eliquis for anticoagulation.  He was resistant to do any potential stress testing and therefore we were not able to decide if we could use a pill in the pocket treatment regimen such as flecainide or propafenone.   Tommy [name] was last seen on July 15, 2016 -he denied any recurrent episodes of A. fib.  Short spells lasting a few seconds but nothing more than a few minutes.  Was active without any major symptoms.  Recent Hospitalizations: None  Studies Personally Reviewed - (if available, images/films reviewed: From Epic Chart or Care Everywhere)  None  Interval History: Tommy [name] returns today overall feeling quite well.  He has noted only a few episodes of short lasting " fluttering sensations"most notably if he overdoes his exertion level.-- .  He has not had to take any additional doses of metoprolol.  Currently taking metoprolol 3 times a day (75 mg, 75 mg and 50 mg in the evening) and this seems to be keeping his palpitations a day.  Every now and then he takes a full 75 mg in the evening.  He has not had any further prolonged episodes to suspect sustained A. fib.    No chest pain or shortness of breath with rest or exertion. No PND, orthopnea or edema.  No lightheadedness, dizziness, weakness or syncope/near syncope. No TIA/amaurosis fugax symptoms. No melena, hematochezia, hematuria, or epstaxis. No claudication.  ROS: A comprehensive was performed. Review of Systems  Constitutional: Negative for malaise/fatigue.  HENT: Negative for nosebleeds.   Respiratory: Negative for cough, shortness of breath and wheezing.   Cardiovascular: Negative for leg swelling (Mild end of day  swelling with some mild varicose veins.).  Gastrointestinal: Negative for abdominal pain, constipation and heartburn.  Musculoskeletal: Negative.   Neurological: Negative for weakness.  Psychiatric/Behavioral: The patient is nervous/anxious.   All other systems reviewed and are negative.  I have reviewed and (if needed) personally updated the patient's problem list, medications, allergies, past medical and surgical history, social and family history.   Past Medical History:  Diagnosis Date  . Anxiety   . Asthma    ASTHMA AS A CHILD - NO PROBLEMS NOW  . Atrial flutter with controlled response ([place]) 04/02/2015  . Bone tumor    BONE TUMORS LEFT UPPER ARM, ABOVE LEFT KNEE AND MAYBE IN JAWS --TOLD BENIGN   . Cancer Tommy [name])    PROSTATE CANCER  . Depression   . GERD (gastroesophageal reflux disease)    NOT A RECENT PROBLEM - BUT DID TAKE OMEPRAZOLE COUPLE OF MONTHS AGO   . Headache(784.0)    MIGRAINES  . Heart murmur    TOLD HE HAS A SLIGHT HEART MURMUR  . Hypertension   . Pain    HX OF LOWER BACK PAIN AND RUPTURED DISK 2001 -BUT NO SURGERY  . Palpitations   . Paroxysmal atrial fibrillation (HCC)   . PONV (postoperative nausea and vomiting)     Past Surgical History:  Procedure Laterality Date  . BENIGN SKIN TUMOR EXCISED LEFT ARMPIT  ? 1972    . BONE TUMOR EXCISION  1973   LEFT LEG  . HERNIA REPAIR     RIGHT  Tommy [name] LEFT ING 1994 WITH MESH  . TRANSTHORACIC ECHOCARDIOGRAM  04/03/2015   EF 55 and 60%. No regional WMA. Mild MR. Mildly dilated ascending aorta (4.5 mm in the aortic root). Mild RA dilation.    Current Meds  Medication Sig  . acetaminophen (TYLENOL) 325 MG tablet Take 2 tablets (650 mg total) by mouth every 4 (four) hours as needed for headache or mild pain.  Marland Kitchen apixaban (ELIQUIS) 5 MG TABS tablet Take 1 tablet (5 mg total) by mouth 2 (two) times daily.  . clorazepate (TRANXENE) 3.75 MG tablet Take 3.75 mg by mouth 2 (two) times a week.   . clotrimazole  (LOTRIMIN) 1 % cream Apply 1 application topically 2 (two) times daily.  Marland Kitchen latanoprost (XALATAN) 0.005 % ophthalmic solution Place 1 drop into both eyes at bedtime. Alternates 1 drop in each eye daily  . metoprolol tartrate (LOPRESSOR) 50 MG tablet TAKE 1 & 1/2 (ONE & ONE-HALF) TABLETS BY MOUTH THREE TIMES DAILY  . Metoprolol Tartrate 75 MG TABS TAKE 1 TABLET BY MOUTH THREE TIMES DAILY  . nystatin ointment (MYCOSTATIN) Apply 1 application topically every morning.  Marland Kitchen omeprazole (PRILOSEC OTC) 20 MG tablet Take 10 mg by mouth daily as needed.   . polyethylene glycol powder (GLYCOLAX/MIRALAX) powder Take 1 Container by mouth once. Takes it a bit less  . sertraline (ZOLOFT) 50 MG tablet Take 50 mg by mouth daily.  Marland Kitchen terbinafine (LAMISIL) 1 % cream Apply 1 application topically daily.  Marland Kitchen tolnaftate (TINACTIN) 1 % spray Apply 1 spray topically daily.    Allergies  Allergen Reactions  . Ibuprofen     Sleepwalking  . Paxil [Paroxetine Hcl] Other (See Comments)    Droopy face/disoriented.  Tommy [name] [Pseudoephedrine]     Causes a "rebound headache"     Social History   Socioeconomic History  . Marital status: Single    Spouse name: None  . Number of children: None  . Years of education: None  . Highest education level: None  Social Needs  . Financial resource strain: None  . Food insecurity - worry: None  . Food insecurity - inability: None  . Transportation needs - medical: None  . Transportation needs - non-medical: None  Occupational History  . None  Tobacco Use  . Smoking status: Former Smoker    Types: Cigarettes, Pipe, Cigars    Last attempt to quit: 09/21/1970    Years since quitting: 46.8  . Smokeless tobacco: Never Used  Substance and Sexual Activity  . Alcohol use: Yes    Alcohol/week: 0.6 oz    Types: 1 Glasses of wine per week    Comment: Occassionally   . Drug use: No  . Sexual activity: None  Other Topics Concern  . None  Social History Narrative   Single.  Lives with his brother Tommy [name]. Does retail work /bookkeeping at [employer]    family history includes Cancer in his father; Cirrhosis in his father; Healthy in his brother and sister; Heart disease in his mother; Heart failure in his mother.  Wt Readings from Last 3 Encounters:  07/29/17 203 lb (92.1 kg)  07/15/16 206 lb 6.4 oz (93.6 kg)  10/09/15 203 lb (92.1 kg)    PHYSICAL EXAM BP 140/62   Pulse 63   Ht 6\' 8"  (2.032 m)   Wt 203 lb (92.1 kg)   SpO2 97%   BMI 22.30 kg/m  Physical Exam  Constitutional: He is oriented to person, place, and time. He  appears well-developed and well-nourished. No distress.  Very tall, thin gentleman.  Borderline marfanoid appearance.  HENT:  Head: Normocephalic and atraumatic.  Eyes: EOM are normal.  Neck: Normal range of motion. Neck supple. No hepatojugular reflux and no JVD present. Carotid bruit is not present.  Cardiovascular: Normal rate, regular rhythm, normal heart sounds and normal pulses.  No extrasystoles are present. PMI is not displaced. Exam reveals no gallop.  No murmur heard. Pulmonary/Chest: Effort normal and breath sounds normal. No respiratory distress. He has no wheezes. He has no rales.  Abdominal: Soft. Bowel sounds are normal. He exhibits no distension. There is no tenderness. There is no rebound.  Musculoskeletal: Normal range of motion. He exhibits no edema or deformity.  Neurological: He is alert and oriented to person, place, and time.  Skin: Skin is warm and dry. No rash noted. No erythema.  Mild bilateral lower extremity varicose veins (L>R).  Psychiatric: He has a normal mood and affect. His behavior is normal. Judgment and thought content normal.  Nursing note and vitals reviewed.   Adult ECG Report  Rate: 60;  Rhythm: normal sinus rhythm and IVCD/inc-RBBB, otherwise normal axis, intervals and durations;   Narrative Interpretation: Stable EKG   Other studies Reviewed: Additional studies/ records that were reviewed  today include:  Recent Labs:  n/a   ASSESSMENT / PLAN: Problem List Items Addressed This Visit    Current use of long term anticoagulation (Chronic)    With CHA2DS2Vasc score of 1 (hypertension), he has chosen full anticoagulation with Eliquis.      Essential hypertension (Chronic)    Blood pressure is borderline controlled today, but has usually been well controlled.  Would continue current dose of beta-blocker.      PAF (paroxysmal atrial fibrillation) ([place]); CHA2DS2-VASc Score 1; ~ on Eliquis - Primary (Chronic)    He seems to have done well over the last year with no recurrence of A. fib since I last saw him. Remains on stable dose of beta-blocker.  After long discussion with I last saw him he wanted to stay on Eliquis.  No bleeding issues.      Relevant Orders   EKG 12-Lead (Completed)   Venous stasis of both lower extremities (Chronic)    Relatively euvolemic.  Recommend that he wear support stockings.  Avoid diuretic at this time. If additional blood pressure control is needed, would consider adding HCTZ or chlorthalidone.         Current medicines are reviewed at length with the patient today. (+/- concerns) none The following changes have been made:None  Patient Instructions  No changes with current medications     Your physician wants you to follow-up in 12 months with DR HARDING. You will receive a reminder letter in the mail two months in advance. If you don't receive a letter, please call our office to schedule the follow-up appointment.   If you need a refill on your cardiac medications before your next appointment, please call your pharmacy.    Studies Ordered:   Orders Placed This Encounter  Procedures  . EKG 12-Lead      Glenetta Hew, M.D., M.S. Interventional Cardiologist   Pager # [phone] Phone # [phone] [address] [place], [place] [address]

## 2017-08-01 NOTE — Assessment & Plan Note (Signed)
Relatively euvolemic.  Recommend that he wear support stockings.  Avoid diuretic at this time. If additional blood pressure control is needed, would consider adding HCTZ or chlorthalidone.

## 2017-08-01 NOTE — Assessment & Plan Note (Signed)
He seems to have done well over the last year with no recurrence of A. fib since I last saw him. Remains on stable dose of beta-blocker.  After long discussion with I last saw him he wanted to stay on Eliquis.  No bleeding issues.

## 2017-08-01 NOTE — Assessment & Plan Note (Signed)
With CHA2DS2Vasc score of 1 (hypertension), he has chosen full anticoagulation with Eliquis.

## 2017-08-01 NOTE — Assessment & Plan Note (Signed)
Blood pressure is borderline controlled today, but has usually been well controlled.  Would continue current dose of beta-blocker.

## 2017-08-13 ENCOUNTER — Other Ambulatory Visit: Payer: Self-pay | Admitting: Cardiology

## 2017-08-16 NOTE — Telephone Encounter (Signed)
Last blood work available > 53yrs old. Request for new CBC & BMP place for PCP

## 2017-10-15 ENCOUNTER — Other Ambulatory Visit: Payer: Self-pay | Admitting: Cardiology

## 2017-10-15 NOTE — Telephone Encounter (Signed)
REFILL 

## 2018-04-13 ENCOUNTER — Encounter (HOSPITAL_COMMUNITY): Payer: Self-pay | Admitting: *Deleted

## 2018-04-13 ENCOUNTER — Emergency Department (HOSPITAL_COMMUNITY): Payer: BLUE CROSS/BLUE SHIELD

## 2018-04-13 ENCOUNTER — Emergency Department (HOSPITAL_COMMUNITY)
Admission: EM | Admit: 2018-04-13 | Discharge: 2018-04-13 | Disposition: A | Discharge status: Home / Self Care | Payer: BLUE CROSS/BLUE SHIELD | Attending: Emergency Medicine | Admitting: Emergency Medicine

## 2018-04-13 ENCOUNTER — Other Ambulatory Visit: Payer: Self-pay

## 2018-04-13 DIAGNOSIS — Z87891 Personal history of nicotine dependence: Secondary | ICD-10-CM | POA: Diagnosis not present

## 2018-04-13 DIAGNOSIS — I480 Paroxysmal atrial fibrillation: Secondary | ICD-10-CM | POA: Diagnosis not present

## 2018-04-13 DIAGNOSIS — Z8546 Personal history of malignant neoplasm of prostate: Secondary | ICD-10-CM | POA: Insufficient documentation

## 2018-04-13 DIAGNOSIS — F419 Anxiety disorder, unspecified: Secondary | ICD-10-CM | POA: Insufficient documentation

## 2018-04-13 DIAGNOSIS — R002 Palpitations: Secondary | ICD-10-CM | POA: Diagnosis present

## 2018-04-13 DIAGNOSIS — Z7901 Long term (current) use of anticoagulants: Secondary | ICD-10-CM | POA: Insufficient documentation

## 2018-04-13 DIAGNOSIS — Z79899 Other long term (current) drug therapy: Secondary | ICD-10-CM | POA: Diagnosis not present

## 2018-04-13 LAB — BASIC METABOLIC PANEL
Anion gap: 9 (ref 5–15)
BUN: 20 mg/dL (ref 8–23)
CO2: 31 mmol/L (ref 22–32)
Calcium: 9.1 mg/dL (ref 8.9–10.3)
Chloride: 103 mmol/L (ref 98–111)
Creatinine, Ser: 0.96 mg/dL (ref 0.61–1.24)
GFR calc Af Amer: >60 mL/min (ref >60)
GFR calc non Af Amer: >60 mL/min (ref >60)
Glucose, Bld: 96 mg/dL (ref 70–99)
Potassium: 3.5 mmol/L (ref 3.5–5.1)
Sodium: 143 mmol/L (ref 135–145)

## 2018-04-13 LAB — CBC
HCT: 39.1 % (ref 39.0–52.0)
Hemoglobin: 13.2 g/dL (ref 13.0–17.0)
MCH: 31.4 pg (ref 26.0–34.0)
MCHC: 33.8 g/dL (ref 30.0–36.0)
MCV: 93.1 fL (ref 78.0–100.0)
Platelets: 122 10*3/uL — ABNORMAL LOW (ref 150–400)
RBC: 4.2 MIL/uL — ABNORMAL LOW (ref 4.22–5.81)
RDW: 12.4 % (ref 11.5–15.5)
WBC: 4.5 10*3/uL (ref 4.0–10.5)

## 2018-04-13 MED ORDER — SODIUM CHLORIDE 0.9 % IV BOLUS
1000.0000 mL | Freq: Once | INTRAVENOUS | Status: AC
  Administered 2018-04-13: 1000 mL via INTRAVENOUS

## 2018-04-13 MED ORDER — METOPROLOL TARTRATE 25 MG PO TABS
75.0000 mg | ORAL_TABLET | Freq: Once | ORAL | Status: AC
  Administered 2018-04-13: 75 mg via ORAL
  Filled 2018-04-13: qty 3

## 2018-04-13 NOTE — Discharge Instructions (Addendum)
Your evaluated in the emergency department for recurrence of your atrial fibrillation.  You had blood work chest x-ray and EKG.  Your heart rate is not too fast and your symptoms are mild enough that you can return home.  Please continue your regular medicines.  Please call your cardiologist tomorrow for further recommendations.  It is likely you will return to normal sinus rhythm within the next 24 hours.  Please return to the emergency department if any worsening symptoms.

## 2018-04-13 NOTE — ED Notes (Signed)
ED Provider at bedside. 

## 2018-04-13 NOTE — ED Provider Notes (Signed)
[facility] DEPT Provider Note   CSN: [id number] Arrival date & time: 04/13/18  5638     History   Chief Complaint Chief Complaint  Patient presents with  . Anxiety  . Atrial Fibrillation    HPI Tommy [name] is a 67 y.o. male.  He has a history of paroxysmal A. fib and is on anticoagulation.  He states after he went to the bathroom this morning on waking up he noticed that his heart rate changed and was irregular.  He denies that it was pain he just because of palpitations that made him very anxious.  He took an extra half tablet of his metoprolol (25 mg) and a dose of his anxiety medicine.  He still continue to feel symptomatic with this and so called 911 and by the time they got there he says he was feeling better.  Says he intermittently will get a few brief palpitations but the last time it lasted this long was a year and a half ago when he needed to be admitted.  We are going to cardiovert him at that time but he spontaneously converted.  He follows with Dr. Ellyn Hack from cardiology.  He denies any recent illness and the only different thing he can think of is we had some chocolate yesterday.  The history is provided by the patient.  Atrial Fibrillation  This is a recurrent problem. The current episode started 1 to 2 hours ago. The problem occurs constantly. The problem has been gradually improving. Pertinent negatives include no chest pain, no abdominal pain, no headaches and no shortness of breath. Nothing aggravates the symptoms. The symptoms are relieved by medications. Treatments tried: medication. The treatment provided mild relief.    Past Medical History:  Diagnosis Date  . Anxiety   . Asthma    ASTHMA AS A CHILD - NO PROBLEMS NOW  . Atrial flutter with controlled response ([place]) 04/02/2015  . Bone tumor    BONE TUMORS LEFT UPPER ARM, ABOVE LEFT KNEE AND MAYBE IN JAWS --TOLD BENIGN   . Cancer [facility])    PROSTATE CANCER  . Depression   . GERD  (gastroesophageal reflux disease)    NOT A RECENT PROBLEM - BUT DID TAKE OMEPRAZOLE COUPLE OF MONTHS AGO   . Headache(784.0)    MIGRAINES  . Heart murmur    TOLD HE HAS A SLIGHT HEART MURMUR  . Hypertension   . Pain    HX OF LOWER BACK PAIN AND RUPTURED DISK 2001 -BUT NO SURGERY  . Palpitations   . Paroxysmal atrial fibrillation (HCC)   . PONV (postoperative nausea and vomiting)     Patient Active Problem List   Diagnosis Date Noted  . Venous stasis of both lower extremities 07/15/2016  . Current use of long term anticoagulation 05/21/2015  . Anxiety 04/03/2015  . PAF (paroxysmal atrial fibrillation) ([place]); CHA2DS2-VASc Score 1; ~ on Eliquis 04/02/2015  . Essential hypertension 04/02/2015    Past Surgical History:  Procedure Laterality Date  . BENIGN SKIN TUMOR EXCISED LEFT ARMPIT  ? 1972    . BONE TUMOR EXCISION  1973   LEFT LEG  . HERNIA REPAIR     RIGHT ING 1968 AND LEFT ING 1994 WITH MESH  . ROBOT ASSISTED LAPAROSCOPIC RADICAL PROSTATECTOMY N/A 04/13/2013   Procedure: ROBOTIC ASSISTED LAPAROSCOPIC RADICAL PROSTATECTOMY LEVEL 3;  Surgeon: Dutch Gray, MD;  Location: [facility] ORS;  Service: Urology;  Laterality: N/A;  . TRANSTHORACIC ECHOCARDIOGRAM  04/03/2015   EF 55  and 60%. No regional WMA. Mild MR. Mildly dilated ascending aorta (4.5 mm in the aortic root). Mild RA dilation.        Home Medications    Prior to Admission medications   Medication Sig Start Date End Date Taking? Authorizing Provider  acetaminophen (TYLENOL) 325 MG tablet Take 2 tablets (650 mg total) by mouth every 4 (four) hours as needed for headache or mild pain. 04/03/15   Erlene Quan, PA-C  clorazepate (TRANXENE) 3.75 MG tablet Take 3.75 mg by mouth 2 (two) times a week.     [provider]  clotrimazole (LOTRIMIN) 1 % cream Apply 1 application topically 2 (two) times daily.    [provider]  ELIQUIS 5 MG TABS tablet TAKE 1 TABLET BY MOUTH TWICE DAILY. 08/17/17   Leonie Man,  MD  latanoprost (XALATAN) 0.005 % ophthalmic solution Place 1 drop into both eyes at bedtime. Alternates 1 drop in each eye daily    [provider]  metoprolol tartrate (LOPRESSOR) 50 MG tablet TAKE 1 & 1/2 (ONE & ONE-HALF) TABLETS BY MOUTH THREE TIMES DAILY 10/15/17   Leonie Man, MD  Metoprolol Tartrate 75 MG TABS TAKE 1 TABLET BY MOUTH THREE TIMES DAILY 02/01/17   Leonie Man, MD  nystatin ointment (MYCOSTATIN) Apply 1 application topically every morning. 01/24/15   [provider]  omeprazole (PRILOSEC OTC) 20 MG tablet Take 10 mg by mouth daily as needed.     [provider]  polyethylene glycol powder (GLYCOLAX/MIRALAX) powder Take 1 Container by mouth once. Takes it a bit less    [provider]  sertraline (ZOLOFT) 50 MG tablet Take 50 mg by mouth daily. 03/19/15   [provider]  terbinafine (LAMISIL) 1 % cream Apply 1 application topically daily.    [provider]  tolnaftate (TINACTIN) 1 % spray Apply 1 spray topically daily.    [provider]    Family History Family History  Problem Relation Age of Onset  . Heart disease Mother        CABG  . Heart failure Mother   . Cancer Father   . Cirrhosis Father   . Healthy Sister   . Healthy Brother     Social History Social History   Tobacco Use  . Smoking status: Former Smoker    Types: Cigarettes, Pipe, Cigars    Last attempt to quit: 09/21/1970    Years since quitting: 47.5  . Smokeless tobacco: Never Used  Substance Use Topics  . Alcohol use: Yes    Alcohol/week: 0.6 oz    Types: 1 Glasses of wine per week    Comment: Occassionally   . Drug use: No     Allergies   Ibuprofen; Paxil [paroxetine hcl]; and Sudafed [pseudoephedrine]   Review of Systems Review of Systems  Constitutional: Negative for chills and fever.  HENT: Negative for sore throat.   Eyes: Negative for visual disturbance.  Respiratory: Negative for shortness of breath.     Cardiovascular: Positive for palpitations. Negative for chest pain.  Gastrointestinal: Negative for abdominal pain.  Genitourinary: Negative for dysuria.  Musculoskeletal: Negative for back pain and neck pain.  Skin: Negative for rash.  Neurological: Negative for syncope and headaches.     Physical Exam Updated Vital Signs BP 123/78 (BP Location: Right Arm)   Pulse 85   Temp 97.9 F (36.6 C) (Oral)   Resp 20   Ht 6\' 8"  (2.032 m)   Wt 92.1  kg (203 lb)   SpO2 100%   BMI 22.30 kg/m   Physical Exam  Constitutional: He appears well-developed and well-nourished.  HENT:  Head: Normocephalic and atraumatic.  Eyes: Conjunctivae are normal.  Neck: Neck supple.  Cardiovascular: Normal rate. An irregularly irregular rhythm present.  No murmur heard. Pulmonary/Chest: Effort normal and breath sounds normal. No respiratory distress.  Abdominal: Soft. There is no tenderness.  Musculoskeletal: He exhibits no edema, tenderness or deformity.  Neurological: He is alert.  Skin: Skin is warm and dry.  Psychiatric: He has a normal mood and affect.  Nursing note and vitals reviewed.    ED Treatments / Results  Labs (all labs ordered are listed, but only abnormal results are displayed) Labs Reviewed  CBC - Abnormal; Notable for the following components:      Result Value   RBC 4.20 (*)    Platelets 122 (*)    All other components within normal limits  BASIC METABOLIC PANEL    EKG EKG Interpretation  Date/Time:  Wednesday April 13 2018 09:45:16 EDT Ventricular Rate:  77 PR Interval:    QRS Duration: 105 QT Interval:  383 QTC Calculation: 434 R Axis:   62 Text Interpretation:  Atrial fibrillation Minimal ST depression, inferior leads afib new from prior 7/16 Confirmed by Aletta Edouard [phone]) on 04/13/2018 9:59:02 AM   Radiology Dg Chest 2 View  Result Date: 04/13/2018 CLINICAL DATA:  Atrial fibrillation.  Asthma, hypertension EXAM: CHEST - 2 VIEW COMPARISON:  04/02/2015  FINDINGS: Hyperinflation of the lungs compatible with COPD. Lungs clear. Heart is normal size. No effusions or acute bony abnormality. IMPRESSION: COPD.  No active disease. Electronically Signed   By: Rolm Baptise M.D.   On: 04/13/2018 10:54    Procedures Procedures (including critical care time)  Medications Ordered in ED Medications  metoprolol tartrate (LOPRESSOR) tablet 75 mg (75 mg Oral Given 04/13/18 1008)  sodium chloride 0.9 % bolus 1,000 mL (0 mLs Intravenous Stopped 04/13/18 1517)     Initial Impression / Assessment and Plan / ED Course  I have reviewed the triage vital signs and the nursing notes.  Pertinent labs & imaging results that were available during my care of the patient were reviewed by me and considered in my medical decision making (see chart for details).  Clinical Course as of Apr 14 1631  Wed Apr 13, 2574  6072 67 year old male with paroxysmal A. fib  on anticoagulation here with another episode of A. fib.  He is rate controlled in the 70s but remains in A. fib.  He is due for his next dose of beta-blocker so I have ordered that for him.  We are checking some screening labs and will keep him on the monitor.  Hopefully he will convert on his own.   [MB]  6269 Reevaluated patient.  Heart rate in the 70s but still in A. fib.  Labs with no significant metabolic derangement.  His platelets are low but has been seen on prior blood work.   [MB]  4854 Reevaluated, still remains in a rate controlled A. fib.   [MB]  1310 Patient continues to feel improved.  He is still in A. fib although showing P waves in front of any complexes so it still appears he may convert.  He is asking for something to eat narcotic and some IV fluids 2.   [MB]  1333 Discussed with Dr. Angelena Form from cardiology.  He says with the patient being minimally symptomatic with normal vital  signs he would recommend not intervening on this rhythm as he likely will convert on his own with just some time.  They  will send a message over the patient's cardiologist Dr. Ellyn Hack and arrange for his follow-up.  Will review with patient.   [MB]    Clinical Course User Index [MB] Hayden Rasmussen, MD     Final Clinical Impressions(s) / ED Diagnoses   Final diagnoses:  Paroxysmal atrial fibrillation [facility])    ED Discharge Orders    None       Hayden Rasmussen, MD 04/13/18 [phone]

## 2018-04-13 NOTE — ED Triage Notes (Signed)
Per EMS, pt woke up with racing heart rate and became anxious. Pt called EMS and was advised to come in. Pt was a-fib for EMS. Pt is on eliquis and metoprolol. Pt took metoprolol and clorazepate this morning. Pt states his anxiety is subsiding.   BP 133/84 HR 105 O2 100% RR 16

## 2018-04-13 NOTE — ED Notes (Signed)
Bed: OZ22 Expected date:  Expected time:  Means of arrival:  Comments: EMS/Anxiety hx a-fib

## 2018-04-13 NOTE — ED Notes (Signed)
Patient transported to X-ray 

## 2018-04-14 ENCOUNTER — Telehealth (HOSPITAL_COMMUNITY): Payer: Self-pay | Admitting: *Deleted

## 2018-04-14 NOTE — Telephone Encounter (Signed)
Pt seen in ED with afib.  Was instructed to call Dr. Allison Quarry office to make appt.  No appt listed.  Left msg with Dr. Darcus Pester office number and reminder to make follow up appt.  Also left afib clinic number

## 2018-04-21 ENCOUNTER — Encounter: Payer: Self-pay | Admitting: Cardiology

## 2018-04-21 ENCOUNTER — Ambulatory Visit (INDEPENDENT_AMBULATORY_CARE_PROVIDER_SITE_OTHER): Payer: BLUE CROSS/BLUE SHIELD | Admitting: Cardiology

## 2018-04-21 VITALS — BP 128/77 | HR 48 | Ht >= 80 in | Wt 198.2 lb

## 2018-04-21 DIAGNOSIS — I10 Essential (primary) hypertension: Secondary | ICD-10-CM | POA: Diagnosis not present

## 2018-04-21 DIAGNOSIS — Z7901 Long term (current) use of anticoagulants: Secondary | ICD-10-CM

## 2018-04-21 DIAGNOSIS — I480 Paroxysmal atrial fibrillation: Secondary | ICD-10-CM

## 2018-04-21 MED ORDER — METOPROLOL TARTRATE 50 MG PO TABS: 100.0000 mg | ORAL_TABLET | Freq: Three times a day (TID) | ORAL | 3 refills | Status: DC

## 2018-04-21 NOTE — Patient Instructions (Signed)
Medication Instructions:   INCREASE METOPROLOL TO 100 MG THREE TIMES DAILY   Follow-Up:  Your physician wants you to follow-up in: [place] will receive a reminder letter in the mail two months in advance. If you don't receive a letter, please call our office to schedule the follow-up appointment.   If you need a refill on your cardiac medications before your next appointment, please call your pharmacy.

## 2018-04-21 NOTE — Progress Notes (Signed)
PCP: Jani Gravel, MD  Clinic Note: Chief Complaint  Patient presents with  . Hospitalization Follow-up    ER visit for A. fib  . Atrial Fibrillation    HPI: Tommy [name] is a 67 y.o. male with a PMH below who presents today for annual follow-up for paroxysmal atrial fibrillation - with recent ER visit for Afib. He initially was seen by Roderic Palau NP in the A. fib clinic.  Placed on beta-blocker for rate control and Eliquis for anticoagulation.  He was resistant to do any potential stress testing and therefore we were not able to decide if we could use a pill in the pocket treatment regimen such as flecainide or propafenone.  EGBERT SEIDEL was last seen on July 29, 2017 -only noted short episodes of fluttering sensations.  Usually associated with overdoing exertion.  Was currently taking for the most part 75 mg metoprolol 3 times daily and alternate taking 50 at 75 mg in the evening.  Recent Hospitalizations:   April 13, 2018 ER - Afib --He had a recurrent episode of A. Fib taking an extra dose of that was not alleviated by metoprolol and his antianxiety medication.  He was not sure whether I need to determine or not, but EMS encouraged him to go to the emergency room.  He was still in A. fib upon arrival to the ER with an episode lasting almost 2 hours at that point.  He did not have any chest pain or dyspnea.  Just uneasy sensation.  The ER physicians felt that was probably the likely convert on his own, and that he did convert back to sinus rhythm spontaneously. -->  He indicates that he was a little upset because he overheard nurses complaining about why he was there.   Studies Personally Reviewed - (if available, images/films reviewed: From Epic Chart or Care Everywhere)  None  Interval History: Givanni returns today overall feeling fine.  He has not had any more episodes of A. fib since his ER visit.  He says that he has started taking 100 mg of metoprolol at least for 2-3  doses and then 75 in the evenings.  But the most part in the last few days is even taken 100 mg per dose.  He seems to think that this seems to control his symptoms.  He insists that his episodes of A. fib currently his blood pressure goes up a little bit experiences in the 130s 140 range.  He feels that if he brings a blood pressure down with a beta-blocker if he feels better.  He is totally astigmatic with the fact that his heart rate is 48 bpm today.  He has no fatigue dizziness.  With the increase his beta-blocker, he is not really noticing any 1 of the fluttering sensation.  He seems to be more relaxed.  He still denies any chest tightness or pressure at rest or exertion.  No syncope or near syncope, TIA or amaurosis fugax symptoms.  No bleeding issues on Eliquis --No melena, hematochezia, hematuria, or epstaxis. No claudication.  ROS: A comprehensive was performed. Review of Systems  Constitutional: Negative for malaise/fatigue.  HENT: Negative for nosebleeds.   Cardiovascular: Negative for leg swelling (Mild end of day swelling with some mild varicose veins.).  Gastrointestinal: Negative for abdominal pain, constipation and heartburn.  Neurological: Negative for dizziness and weakness.  Endo/Heme/Allergies: Does not bruise/bleed easily.  Psychiatric/Behavioral: The patient is nervous/anxious.   All other systems reviewed and are negative.  I have reviewed and (if needed) personally updated the patient's problem list, medications, allergies, past medical and surgical history, social and family history.   Past Medical History:  Diagnosis Date  . Anxiety   . Asthma    ASTHMA AS A CHILD - NO PROBLEMS NOW  . Atrial flutter with controlled response ([place]) 04/02/2015  . Bone tumor    BONE TUMORS LEFT UPPER ARM, ABOVE LEFT KNEE AND MAYBE IN JAWS --TOLD BENIGN   . Cancer [facility])    PROSTATE CANCER  . Depression   . GERD (gastroesophageal reflux disease)    NOT A RECENT PROBLEM - BUT DID  TAKE OMEPRAZOLE COUPLE OF MONTHS AGO   . Headache(784.0)    MIGRAINES  . Heart murmur    TOLD HE HAS A SLIGHT HEART MURMUR  . Hypertension   . Pain    HX OF LOWER BACK PAIN AND RUPTURED DISK 2001 -BUT NO SURGERY  . Palpitations   . Paroxysmal atrial fibrillation ([facility])   . PONV (postoperative nausea and vomiting)     Past Surgical History:  Procedure Laterality Date  . BENIGN SKIN TUMOR EXCISED LEFT ARMPIT  ? 1972    . BONE TUMOR EXCISION  1973   LEFT LEG  . HERNIA REPAIR     RIGHT ING 1968 AND LEFT ING 1994 WITH MESH  . ROBOT ASSISTED LAPAROSCOPIC RADICAL PROSTATECTOMY N/A 04/13/2013   Procedure: ROBOTIC ASSISTED LAPAROSCOPIC RADICAL PROSTATECTOMY LEVEL 3;  Surgeon: Dutch Gray, MD;  Location: [facility];  Service: Urology;  Laterality: N/A;  . TRANSTHORACIC ECHOCARDIOGRAM  04/03/2015   EF 55 and 60%. No regional WMA. Mild MR. Mildly dilated ascending aorta (4.5 mm in the aortic root). Mild RA dilation.    Current Meds  Medication Sig  . acetaminophen (TYLENOL) 325 MG tablet Take 2 tablets (650 mg total) by mouth every 4 (four) hours as needed for headache or mild pain.  . clorazepate (TRANXENE) 3.75 MG tablet Take 3.75 mg by mouth. Once to twice weekly as needed  . clotrimazole (LOTRIMIN) 1 % cream Apply 1 application topically daily as needed (fungal infection).  Marland Kitchen ELIQUIS 5 MG TABS tablet TAKE 1 TABLET BY MOUTH TWICE DAILY.  Marland Kitchen latanoprost (XALATAN) 0.005 % ophthalmic solution Place 1 drop into both eyes at bedtime. Alternates 1 drop in each eye daily  . nystatin ointment (MYCOSTATIN) Apply 1 application topically every morning.  . terbinafine (LAMISIL) 1 % cream Apply 1 application topically daily as needed (fungal infection).  . [DISCONTINUED] metoprolol tartrate (LOPRESSOR) 50 MG tablet TAKE 1 & 1/2 (ONE & ONE-HALF) TABLETS BY MOUTH THREE TIMES DAILY  . [DISCONTINUED] Metoprolol Tartrate 75 MG TABS TAKE 1 TABLET BY MOUTH THREE TIMES DAILY (Patient taking differently: TAKE 2 TABLETS  OR 1 1/2 TABLETS BY MOUTH EVERY 8 HRS)    Allergies  Allergen Reactions  . Ibuprofen     Sleepwalking  . Paxil [Paroxetine Hcl] Other (See Comments)    Droopy face/disoriented.  Ebbie Ridge [Pseudoephedrine]     Causes a "rebound headache"    Social History   Tobacco Use  . Smoking status: Former Smoker    Types: Cigarettes, Pipe, Cigars    Last attempt to quit: 09/21/1970    Years since quitting: 47.6  . Smokeless tobacco: Never Used  Substance Use Topics  . Alcohol use: Yes    Alcohol/week: 0.6 oz    Types: 1 Glasses of wine per week    Comment: Occassionally   . Drug use: No  Social History   Social History Narrative   Single. Lives with his brother Jenny Reichmann. Does retail work /bookkeeping at [employer]     family history includes Cancer in his father; Cirrhosis in his father; Healthy in his brother and sister; Heart disease in his mother; Heart failure in his mother.  Wt Readings from Last 3 Encounters:  04/21/18 198 lb 3.2 oz (89.9 kg)  04/13/18 203 lb (92.1 kg)  07/29/17 203 lb (92.1 kg)    PHYSICAL EXAM BP 128/77   Pulse (!) 48   Ht 6\' 8"  (2.032 m)   Wt 198 lb 3.2 oz (89.9 kg)   BMI 21.77 kg/m   Physical Exam  Constitutional: He is oriented to person, place, and time. He appears well-developed and well-nourished. No distress.  Very tall, thin gentleman.  Borderline marfanoid appearance.  HENT:  Head: Normocephalic and atraumatic.  Neck: Normal range of motion. Neck supple. No hepatojugular reflux and no JVD present. Carotid bruit is not present.  Cardiovascular: Regular rhythm, normal heart sounds and normal pulses.  No extrasystoles are present. Bradycardia present. PMI is not displaced. Exam reveals no gallop.  No murmur heard. Pulmonary/Chest: Effort normal and breath sounds normal. No respiratory distress. He has no wheezes. He has no rales.  Abdominal: Soft. Bowel sounds are normal. He exhibits no distension. There is no tenderness. There is no rebound.    Musculoskeletal: Normal range of motion. He exhibits no edema.  Neurological: He is alert and oriented to person, place, and time.  Skin:  Mild bilateral lower extremity varicose veins (L>R).  Psychiatric: He has a normal mood and affect. His behavior is normal. Judgment and thought content normal.  Nursing note and vitals reviewed.   Adult ECG Report  Rate: 60;  Rhythm: sinus bradycardia, premature atrial contractions (PAC) and Normal axis and durations.;   Narrative Interpretation: Stable EKG - rate lower   Other studies Reviewed: Additional studies/ records that were reviewed today include:  Recent Labs:   Lab Results  Component Value Date   CREATININE 0.96 04/13/2018   BUN 20 04/13/2018   NA 143 04/13/2018   K 3.5 04/13/2018   CL 103 04/13/2018   CO2 31 04/13/2018     ASSESSMENT / PLAN: Problem List Items Addressed This Visit    Current use of long term anticoagulation (Chronic)    CHAD2D2Vasc Score 1 (HTN) - he prefer to be on DOAC - Eliquis.  No bleeding issues.  Being on Eliquis will allow [place] to do either chemical or mechanical cardioversion for recurrence.       Essential hypertension (Chronic)    He seems to be happy with the lower blood pressures and will therefore continue with100 mg 3 times daily dosing.      Relevant Medications   metoprolol tartrate (LOPRESSOR) 50 MG tablet   PAF (paroxysmal atrial fibrillation) ([place]); CHA2DS2-VASc Score 1; ~ on Eliquis - Primary (Chronic)    1 recurrence since I first saw him.  He still has been good to do any type of  antiarrhythmic agent.  He declined stress test again. No chest pain or pressure associated with it.  He is happy with taking 100 mg 3 times daily of metoprolol therefore continue to prescribe that.  He wants to keep 50 mg tablets which will allow him to adjust himself. We will continue Eliquis.  If he has other breakthroughs I do think that we could potentially cardiovert him if he was in A. fib on  Eliquis. We discussed  the potential of having a pill in the pocket antiarrhythmic versus cardioversion.  He would not want to do so in the pocket unless there is tried in the emergency room setting.  If he were to go back to the emergency room in A. fib and I would like to give him a trial run of flecainide (200 mg) to see if this could cardiovert symptom.  If it does, we can then use that as a "pill-in-the-pocket technique".      Relevant Medications   metoprolol tartrate (LOPRESSOR) 50 MG tablet   Other Relevant Orders   EKG 12-Lead (Completed)     I did spend about 25 minutes with him today. >> 50% of the time was spent in direct patient consultation discussing beta-blocker dosing, anticoagulation, cardioversion options etc.  Current medicines are reviewed at length with the patient today. (+/- concerns) none The following changes have been made:None  Patient Instructions  Medication Instructions:   INCREASE METOPROLOL TO 100 MG THREE TIMES DAILY   Follow-Up:  Your physician wants you to follow-up in: [place] will receive a reminder letter in the mail two months in advance. If you don't receive a letter, please call our office to schedule the follow-up appointment.   If you need a refill on your cardiac medications before your next appointment, please call your pharmacy.    Studies Ordered:   Orders Placed This Encounter  Procedures  . EKG 12-Lead      Glenetta Hew, M.D., M.S. Interventional Cardiologist   Pager # [phone] Phone # [phone] [address] [place], [place] [address]

## 2018-04-23 ENCOUNTER — Encounter: Payer: Self-pay | Admitting: Cardiology

## 2018-04-23 NOTE — Assessment & Plan Note (Signed)
1 recurrence since I first saw him.  He still has been good to do any type of  antiarrhythmic agent.  He declined stress test again. No chest pain or pressure associated with it.  He is happy with taking 100 mg 3 times daily of metoprolol therefore continue to prescribe that.  He wants to keep 50 mg tablets which will allow him to adjust himself. We will continue Eliquis.  If he has other breakthroughs I do think that we could potentially cardiovert him if he was in A. fib on Eliquis. We discussed the potential of having a pill in the pocket antiarrhythmic versus cardioversion.  He would not want to do so in the pocket unless there is tried in the emergency room setting.  If he were to go back to the emergency room in A. fib and I would like to give him a trial run of flecainide (200 mg) to see if this could cardiovert symptom.  If it does, we can then use that as a "pill-in-the-pocket technique".

## 2018-04-23 NOTE — Assessment & Plan Note (Signed)
He seems to be happy with the lower blood pressures and will therefore continue with100 mg 3 times daily dosing.

## 2018-04-23 NOTE — Assessment & Plan Note (Signed)
CHAD2D2Vasc Score 1 (HTN) - he prefer to be on DOAC - Eliquis.  No bleeding issues.  Being on Eliquis will allow [place] to do either chemical or mechanical cardioversion for recurrence.

## 2018-08-26 ENCOUNTER — Other Ambulatory Visit: Payer: Self-pay

## 2018-08-26 DIAGNOSIS — I480 Paroxysmal atrial fibrillation: Secondary | ICD-10-CM

## 2018-08-26 MED ORDER — METOPROLOL TARTRATE 50 MG PO TABS: 100.0000 mg | ORAL_TABLET | Freq: Three times a day (TID) | ORAL | 0 refills | Status: DC

## 2018-08-31 ENCOUNTER — Other Ambulatory Visit: Payer: Self-pay | Admitting: *Deleted

## 2018-08-31 ENCOUNTER — Other Ambulatory Visit: Payer: Self-pay

## 2018-08-31 DIAGNOSIS — I480 Paroxysmal atrial fibrillation: Secondary | ICD-10-CM

## 2018-08-31 MED ORDER — METOPROLOL TARTRATE 50 MG PO TABS: 100.0000 mg | ORAL_TABLET | Freq: Three times a day (TID) | ORAL | 0 refills | Status: DC

## 2018-08-31 MED ORDER — METOPROLOL TARTRATE 50 MG PO TABS: 100.0000 mg | ORAL_TABLET | Freq: Three times a day (TID) | ORAL | 2 refills | Status: DC

## 2018-12-09 ENCOUNTER — Other Ambulatory Visit: Payer: Self-pay | Admitting: Cardiology

## 2019-03-07 IMAGING — CR DG CHEST 2V
3 series · 3 of 3 positions shown · non-contrast
Comparison: 04/02/2015

CLINICAL DATA: Atrial fibrillation.  Asthma, hypertension

EXAM:
CHEST - 2 VIEW

[w chest lat (1 of 2)]
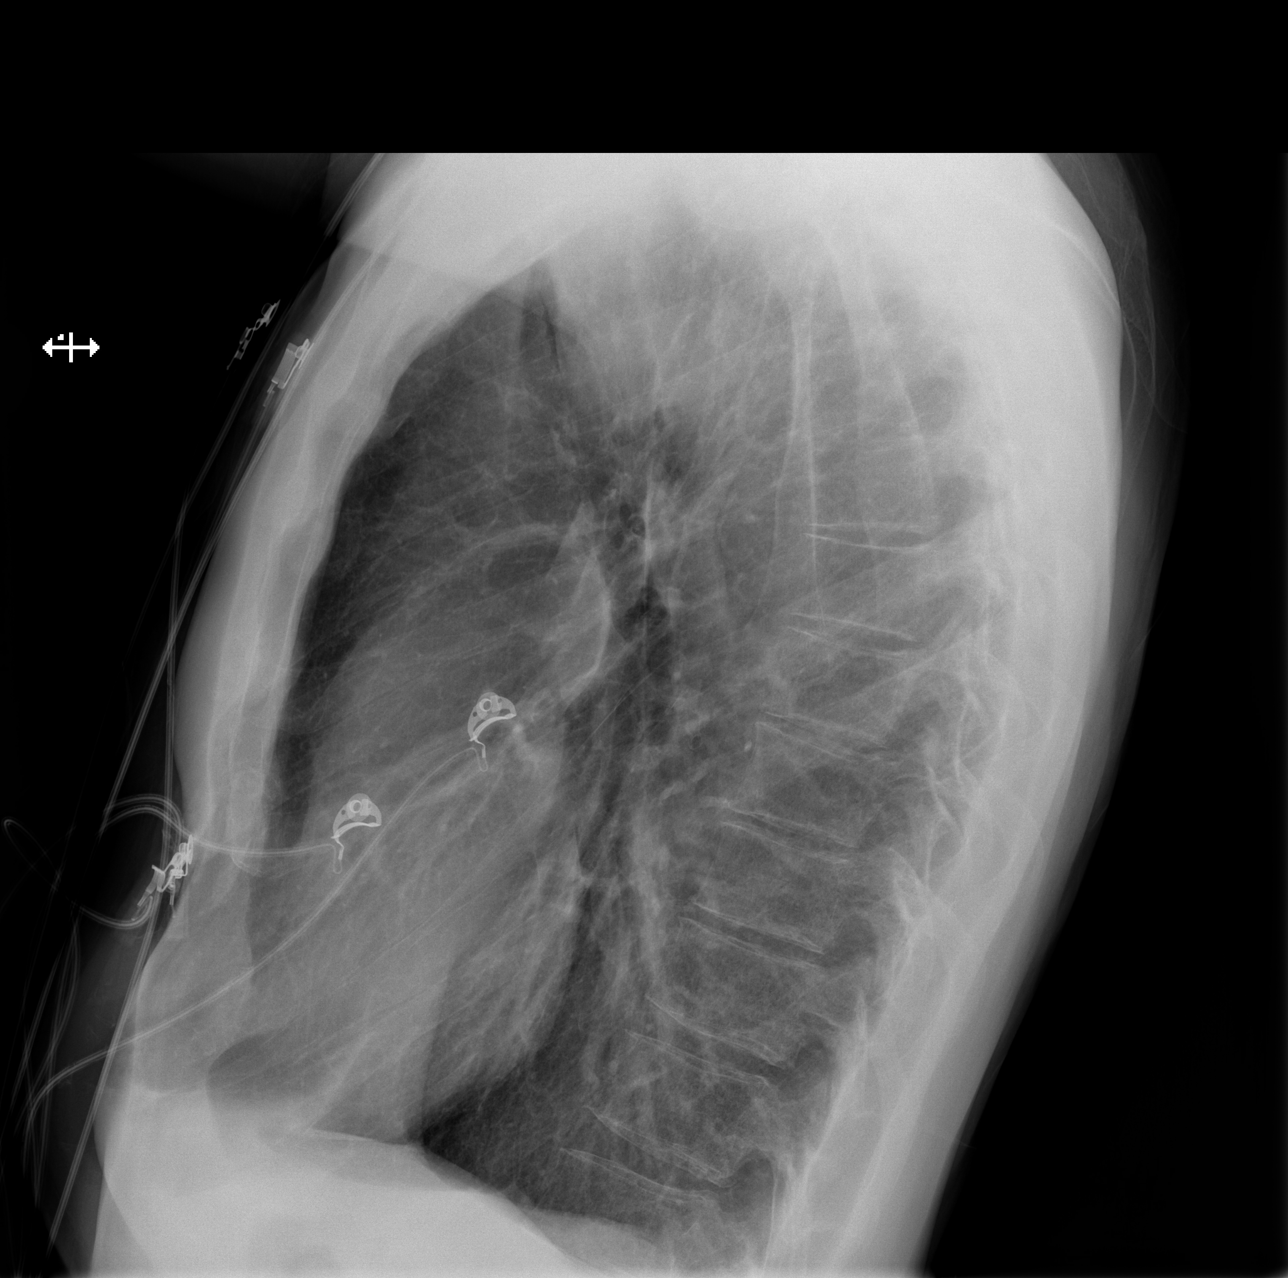

[w chest lat (2 of 2)]
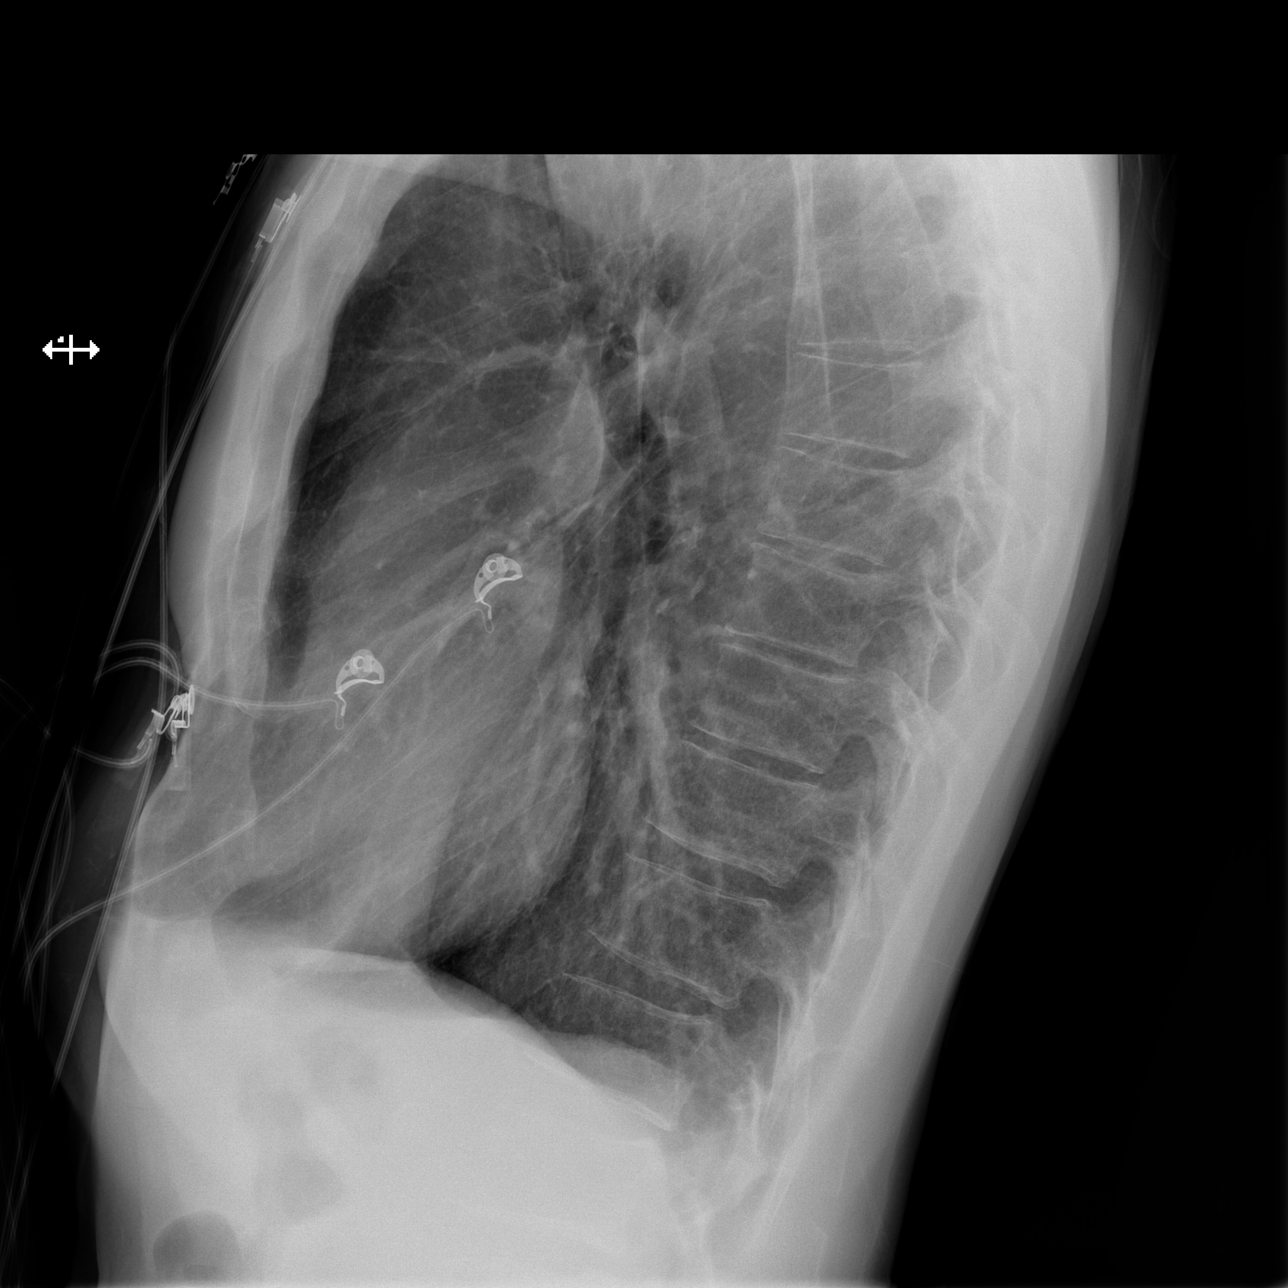

[w chest pa]
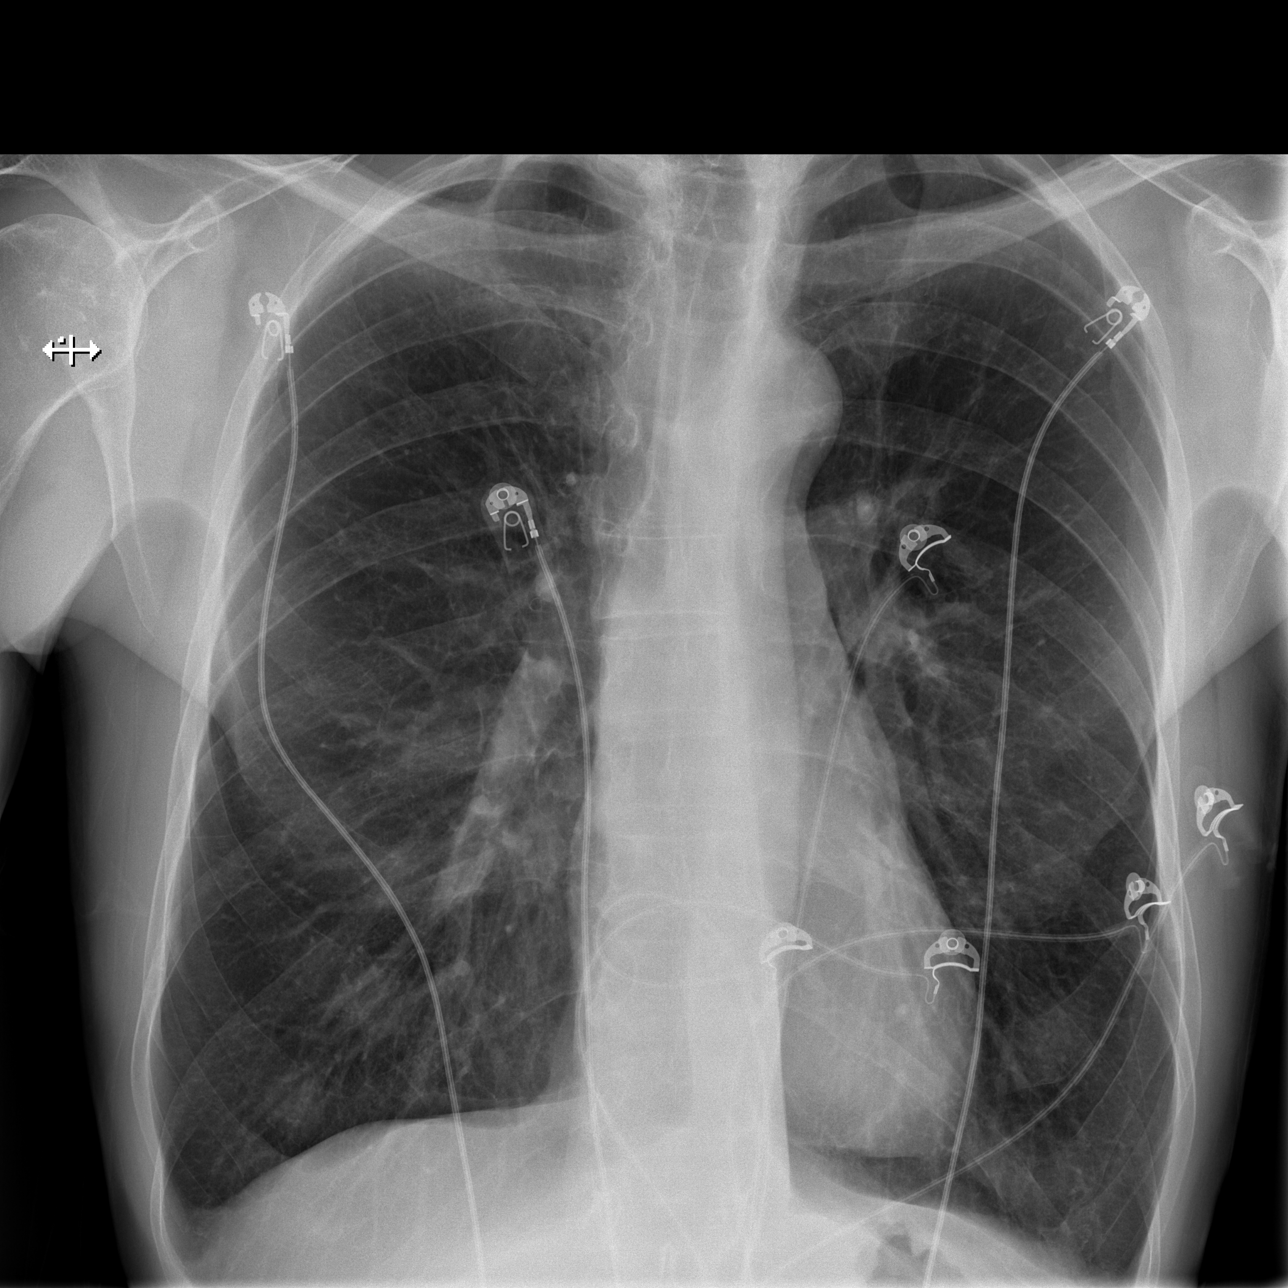

[3 of 3 positions shown; findings below may reference images not displayed]

FINDINGS: Hyperinflation of the lungs compatible with COPD. Lungs clear. Heart
is normal size. No effusions or acute bony abnormality.
IMPRESSION: COPD.  No active disease.

## 2019-06-12 ENCOUNTER — Ambulatory Visit: Payer: BLUE CROSS/BLUE SHIELD | Admitting: Cardiology

## 2019-06-13 ENCOUNTER — Telehealth: Payer: Self-pay | Admitting: Cardiology

## 2019-06-13 NOTE — Telephone Encounter (Signed)
Follow Up:   Pt says his Metoprolol is not helping enough. He says he may need to increase his dose.

## 2019-06-13 NOTE — Telephone Encounter (Signed)
Patient is NOT to adjust his dose without prior MD authorization.  HR of 39 is TOO LOW and may indicate too much metoprolol. Recommend to schedule patient for next available APP/MD visit to discuss symptoms and repeat ECG if needed.

## 2019-06-13 NOTE — Telephone Encounter (Signed)
Called patient, advised with him again to not make any other changes without any MD recommendations.  Patient states "I hear you, but I can not let this get out of control" advised patient to call us if he has any other issues first and we can make adjustments the correct way.  Patient made appointment with PA on 09/28 at 2:45.

## 2019-06-13 NOTE — Telephone Encounter (Signed)
If his heart rate is slow, it would not be a smart thing to take an extra dose of metoprolol.  The best thing to do even if he thinks is in A. fib is to get up and walk around to try to get the heart rate go faster.  If he has taken a beta-blocker such as metoprolol we did need to wait for her to get out of his system and that the heart rate pick back up some.   If he truly is in A. fib, then we talked about what we would do in that situation which would be considering chemical or electrical cardioversion as noted in my last clinic note.  While we usually do have as needed medications, I would recommend when the heart rate is slow down to take extra metoprolol if it is fast i.e. over 100 and he thinks is in A. fib, then it is fine to take extra metoprolol.  Glenetta Hew, MD

## 2019-06-13 NOTE — Telephone Encounter (Signed)
Patient states yesterday he wasn't feeling well, he took his BP 124/85 HR 39 (he thinks he was in AFIB); 134/90 and then took his extra 1/2 his BP was 119/75 HR 53 110/68 August 16th- this is more his normal.  2 tablet every 8 hours- he states yesterday he took an extra 1/2; and he will take an extra 1/2 tablet today to see if any changes.   He does mention having issues with his AFIB at night last night- and states if he has issues again tonight he is going to increase to 3 tablets. I advised him to call us before making any medication changes like this, and if the AFIB becomes an constant issue. Patient verbalized.  Will route to MD and PharmD if okay to increase to the 1/2 to see if any changes.

## 2019-06-14 NOTE — Telephone Encounter (Signed)
Noted  

## 2019-06-18 NOTE — Progress Notes (Signed)
Cardiology Office Note   Date:  06/19/2019   ID:  Tommy, [name] 03-Nov-1950, MRN [id number]  PCP:  Jani Gravel, MD  Cardiologist:  Dr. Ellyn Hack  CC: Hospital follow-up   History of Present Illness: Tommy [name] is a 68 y.o. male who presents for ongoing assessment and management of PAF, followed also in the Afib clinic by Roderic Palau, NP, on Eliquis and Metoprolol. He was last seen by Dr.  Ellyn Hack in 04/2018. At that time he was stable, well controlled blood pressure and HR.    Tommy [name] called our office on 06/13/2019 with complaints of bradycardia. He had taken his BP was 124/85 with HR of 39. He has been manipulating his metoprolol by taking extra doses when his BP is too high. He was advised not to take extra doses without being seen by cardiology. He could be considered for a chemical or electrocal cardioversion. He was advised to only take extra doses of metoprolol if HR > 100 bpm.   The patient comes today and has cut back on his metoprolol to 300 mg daily in divided doses of 100 mg 3 times daily.  He states that he was feeling more irritable feeling more palpitations and noticing that his blood pressure was higher and therefore he decided to take 150 mg 3 times daily which helped to suppress these symptoms.  However he noticed that his blood pressure was decreasing so he went back on his usual dose this morning.  He decreased his Eliquis dose to 2.5 mg twice daily from 5 mg twice daily as he was having excessive bruising.  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  He denies chest pain or dyspnea, no dizziness or presyncope.  He states that he stopped taking his antidepressant medicine, Zoloft 150 mg daily, but does not remember why.  He did state that it helped him stay, when he did take it.    Past Medical History:  Diagnosis Date  . Anxiety   . Asthma    ASTHMA AS A CHILD - NO PROBLEMS NOW  . Atrial flutter with controlled response ([place]) 04/02/2015  . Bone tumor    BONE TUMORS LEFT UPPER ARM, ABOVE LEFT KNEE AND MAYBE IN JAWS --TOLD BENIGN   . Cancer [facility])    PROSTATE CANCER  . Depression   . GERD (gastroesophageal reflux disease)    NOT A RECENT PROBLEM - BUT DID TAKE OMEPRAZOLE COUPLE OF MONTHS AGO   . Headache(784.0)    MIGRAINES  . Heart murmur    TOLD HE HAS A SLIGHT HEART MURMUR  . Hypertension   . Pain    HX OF LOWER BACK PAIN AND RUPTURED DISK 2001 -BUT NO SURGERY  . Palpitations   . Paroxysmal atrial fibrillation (HCC)   . PONV (postoperative nausea and vomiting)     Past Surgical History:  Procedure Laterality Date  . BENIGN SKIN TUMOR EXCISED LEFT ARMPIT  ? 1972    . BONE TUMOR EXCISION  1973   LEFT LEG  . HERNIA REPAIR     RIGHT ING 1968 AND LEFT ING 1994 WITH MESH  . ROBOT ASSISTED LAPAROSCOPIC RADICAL PROSTATECTOMY N/A 04/13/2013   Procedure: ROBOTIC ASSISTED LAPAROSCOPIC RADICAL PROSTATECTOMY LEVEL 3;  Surgeon: Dutch Gray, MD;  Location: [facility];  Service: Urology;  Laterality: N/A;  . TRANSTHORACIC ECHOCARDIOGRAM  2015-04-13   EF 55 and 60%. No regional WMA. Mild Tommy. Mildly dilated ascending aorta (4.5 mm in the aortic root). Mild RA dilation.     Current Outpatient Medications  Medication Sig Dispense Refill  . acetaminophen (TYLENOL) 325 MG tablet Take 2 tablets (650 mg total) by mouth every 4 (four) hours as needed for headache or mild pain.    . clorazepate (TRANXENE) 3.75 MG tablet Take 3.75 mg by mouth. Once to twice weekly as needed    . clotrimazole (LOTRIMIN) 1 % cream Apply 1 application topically daily as needed (fungal infection).    Marland Kitchen ELIQUIS 5 MG TABS tablet TAKE 1 TABLET BY MOUTH TWICE DAILY. 60 tablet 2  . latanoprost (XALATAN) 0.005 % ophthalmic solution Place 1 drop into both eyes at bedtime. Alternates 1 drop in each eye daily     . metoprolol tartrate (LOPRESSOR) 50 MG tablet Take 2 tablets (100 mg total) by mouth 3 (three) times daily. 540 tablet 2  . nystatin ointment (MYCOSTATIN) Apply 1 application topically every morning.  0  . terbinafine (LAMISIL) 1 % cream Apply 1 application topically daily as needed (fungal infection).     No current facility-administered medications for this visit.     Allergies:   Ibuprofen, Paxil [paroxetine hcl], and Sudafed [pseudoephedrine]    Social History:  The patient  reports that he quit smoking about 48 years ago. His smoking use included cigarettes, pipe, and cigars. He has never used smokeless tobacco. He reports current alcohol use of about 1.0 standard drinks of alcohol per week. He reports that he does not use drugs.   Family History:  The patient's family history includes Cancer in his father; Cirrhosis in his father; Healthy in his brother and sister; Heart disease in his mother; Heart failure in his mother.    ROS: All other systems are reviewed and negative. Unless otherwise mentioned in H&P    PHYSICAL EXAM: VS:  BP 110/70   Pulse (!) 51   Ht 6\' 8"  (2.032 m)   Wt 193 lb (87.5 kg)   BMI 21.20 kg/m  , BMI Body mass index is 21.2 kg/m. GEN: Well nourished, well developed, in no acute distress HEENT: normal Neck: no JVD, carotid bruits, or masses Cardiac: RRR; no murmurs, rubs, or gallops,no edema  Respiratory:  Clear to auscultation bilaterally, normal work of breathing GI: soft, nontender, nondistended, + BS MS: no deformity or atrophy Skin: warm and dry, no rash Neuro:  Strength and sensation are intact Psych: euthymic mood, full affect   EKG:   Recent Labs: No results found for requested labs within last 8760 hours.    Lipid Panel    Component Value Date/Time   CHOL 178 Apr 13, 2015 0408   TRIG 57 2015-04-13 0408   HDL 62 13-Apr-2015 0408   CHOLHDL 2.9 04-13-2015 0408   VLDL 11 Apr 13, 2015 0408   LDLCALC 105 (H) 04-13-2015 0408      Wt  Readings from Last 3 Encounters:  06/19/19 193 lb (87.5 kg)  04/21/18 198 lb 3.2 oz (89.9 kg)  04/13/18 203 lb (92.1 kg)      Other studies Reviewed: Echocardiogram 04-13-15  Left ventricle: The cavity size was normal. Systolic function was   normal. The estimated ejection fraction was in the range of 55%   to 60%. Wall motion was normal; there were no regional wall   motion abnormalities. - Aorta: Aortic root dimension:  45 mm (ED). - Ascending aorta: The ascending aorta was mildly dilated. - Mitral valve: There was mild regurgitation. - Right ventricle: The cavity size was mildly dilated. Wall   thickness was normal. - Right atrium: The atrium was mildly dilated.  ASSESSMENT AND PLAN:  1.  Paroxysmal atrial fibrillation: The patient states he has been feeling more irritable and anxious since the COVID pandemic has been going on, with frequent episodes of elevated heart rate and palpitations.  Increase his dose of metoprolol from 100 mg 3 times daily to 150 mg 3 times daily over the last couple of weeks.  He states that symptoms improved but he was noticing that his heart rate and blood pressure was getting lower.  He therefore went back to his usual dose.  He has also decrease Eliquis from 5 mg twice daily to 2.5 mg a day twice daily due to excessive bruising.  I have advised him to take his metoprolol 100 mg 3 times daily as directed and only take an extra dose as needed for rapid heart rate of breakthrough atrial fib.  He will need to call us if he is having to do this persistently.  We may need to think about a different medication regimen.  I am concerned that the patient is manipulating his own medications without cardiology aware until follow-up appointments.  He should be taking Eliquis at 5 mg twice daily but is uncomfortable with this dose.  He is advised to take his medications as directed and is aware that his potential for CVA on lower dose of Eliquis is higher.CHADS VASC Score  1.  Can consider stopping Eliquis on follow-up with low score.  2.  Hypertension: Blood pressure is low normal today.  Would like to follow his blood pressure after he begins taking his metoprolol as directed.  We will see him again in 6 months but sooner if he becomes symptomatic.  3.  Chronic anxiety: I have advised him to follow-up with his PCP about restarting Zoloft.  Is important that his primary care physician be aware that he is not taking medication now and may need to restart him on a different dose.  He verbalizes understanding.  The patient states that he is due to see his primary care next week with labs.  I would also recommend a magnesium and a vitamin D level.  Current medicines are reviewed at length with the patient today.    Labs/ tests ordered today include: None-due to see PCP next week for annual labs.  Phill Myron. West Pugh, ANP, AACC   06/19/2019 3:53 PM    [address] Office [phone] Fax [phone]

## 2019-06-19 ENCOUNTER — Other Ambulatory Visit: Payer: Self-pay

## 2019-06-19 ENCOUNTER — Encounter: Payer: Self-pay | Admitting: Adult Health

## 2019-06-19 ENCOUNTER — Ambulatory Visit: Payer: BC Managed Care – PPO | Admitting: Adult Health

## 2019-06-19 VITALS — BP 110/70 | HR 51 | Ht >= 80 in | Wt 193.0 lb

## 2019-06-19 DIAGNOSIS — F419 Anxiety disorder, unspecified: Secondary | ICD-10-CM

## 2019-06-19 DIAGNOSIS — I480 Paroxysmal atrial fibrillation: Secondary | ICD-10-CM | POA: Diagnosis not present

## 2019-06-19 DIAGNOSIS — I10 Essential (primary) hypertension: Secondary | ICD-10-CM | POA: Diagnosis not present

## 2019-06-19 NOTE — Patient Instructions (Signed)
Medication Instructions:  Continue current medications  If you need a refill on your cardiac medications before your next appointment, please call your pharmacy.  Labwork: None Ordered   Testing/Procedures: None Ordered  Follow-Up: You will need a follow up appointment in 6 months.  Please call our office 2 months in advance to schedule this appointment.  You may see Dr Harding or one of the following Advanced Practice Providers on your designated Care Team:   Rhonda Barrett, PA-C . Kathryn Lawrence, DNP, ANP     At [facility], you and your health needs are our priority.  As part of our continuing mission to provide you with exceptional heart care, we have created designated Provider Care Teams.  These Care Teams include your primary Cardiologist (physician) and Advanced Practice Providers (APPs -  Physician Assistants and Nurse Practitioners) who all work together to provide you with the care you need, when you need it.  Thank you for choosing [facility] at [facility]!!     

## 2019-11-14 ENCOUNTER — Other Ambulatory Visit: Payer: Self-pay | Admitting: Cardiology

## 2019-11-14 DIAGNOSIS — I480 Paroxysmal atrial fibrillation: Secondary | ICD-10-CM

## 2019-11-23 ENCOUNTER — Ambulatory Visit: Payer: Medicare HMO | Attending: Internal Medicine

## 2019-11-23 DIAGNOSIS — Z23 Encounter for immunization: Secondary | ICD-10-CM

## 2019-11-23 NOTE — Progress Notes (Signed)
   Covid-19 Vaccination Clinic  Name:  Tommy [name]    MRN: [id number] DOB: 30-Nov-1950  11/23/2019  Tommy [name] was observed post Covid-19 immunization for 15 minutes without incident. He was provided with Vaccine Information Sheet and instruction to access the V-Safe system.   Tommy [name] was instructed to call 911 with any severe reactions post vaccine: Marland Kitchen Difficulty breathing  . Swelling of face and throat  . A fast heartbeat  . A bad rash all over body  . Dizziness and weakness

## 2019-11-28 ENCOUNTER — Other Ambulatory Visit: Payer: Self-pay | Admitting: Cardiology

## 2019-12-19 ENCOUNTER — Ambulatory Visit: Payer: Medicare HMO | Attending: Internal Medicine

## 2019-12-19 DIAGNOSIS — Z23 Encounter for immunization: Secondary | ICD-10-CM

## 2019-12-19 NOTE — Progress Notes (Signed)
   Covid-19 Vaccination Clinic  Name:  HALFORD SPAYD    MRN: [id number] DOB: January 10, 1951  12/19/2019  Mr. Naimi was observed post Covid-19 immunization for 15 minutes without incident. He was provided with Vaccine Information Sheet and instruction to access the V-Safe system.   Mr. Gadbois was instructed to call 911 with any severe reactions post vaccine: Marland Kitchen Difficulty breathing  . Swelling of face and throat  . A fast heartbeat  . A bad rash all over body  . Dizziness and weakness   Immunizations Administered    Name Date Dose VIS Date Route   Pfizer COVID-19 Vaccine 12/19/2019  3:55 PM 0.3 mL 09/01/2019 Intramuscular   Manufacturer: [place]   Lot: [id number]   Nitro: [id number]

## 2020-01-15 DIAGNOSIS — F419 Anxiety disorder, unspecified: Secondary | ICD-10-CM | POA: Diagnosis not present

## 2020-01-15 DIAGNOSIS — I340 Nonrheumatic mitral (valve) insufficiency: Secondary | ICD-10-CM | POA: Diagnosis not present

## 2020-01-15 DIAGNOSIS — K219 Gastro-esophageal reflux disease without esophagitis: Secondary | ICD-10-CM | POA: Diagnosis not present

## 2020-01-15 DIAGNOSIS — I4891 Unspecified atrial fibrillation: Secondary | ICD-10-CM | POA: Diagnosis not present

## 2020-01-17 NOTE — Progress Notes (Signed)
Cardiology Office Note   Date:  01/22/2020   ID:  Tommy [name] [birth year], MRN [id number]  PCP:  Tommy Gravel, MD  Cardiologist:  Dr. Ellyn [name] CC: Follow Up   History of Present Illness: Tommy [name] is a 69 y.o. male who presents for  ongoing assessment and management of PAF, followed also in the Afib clinic by Tommy Palau, NP, on Eliquis and Metoprolol.  He was allowed to take extra doses should his heart rate get greater than 100 bpm, only.  On last office visit dated 06/19/2019, he complained of being more irritable and anxious in the setting of pandemic.  Dr. Ellyn [name] increased his metoprolol from 100 mg 3 times daily to 150 mg 3 times daily.  He did not want to take the higher dose.  He therefore returned to metoprolol 100 mg 3 times daily and was going to take the extra dose of metoprolol should his heart rate increase.  There was a concern that he was manipulating his medication regimen without cardiology being aware.  He was advised to take his medications as directed.  He was to continue taking Eliquis as directed as well.  His blood pressure was well controlled.  Tommy [name] comes today and has continued to manipulate his medications.  He is not taking Eliquis as directed, he is taking 1 in the morning and a half of 1 at nighttime (5 mg in a.m. 2.5 mh in the PM), he states that it is because he does not like it when he cuts himself and bleed so much.  He is also not taking metoprolol as directed.  He is taking metoprolol 50 mg tablets 2-1/2 tablets 3 times daily.  (125 mg 3 times daily).  He states that he will take the extra tablet if he feels palpitations.  He has received his Covid vaccine March 3 and March 30.  He is concerned about having blood draws it will reduce his immunity.  Otherwise he denies any chest pain dyspnea on exertion hemoptysis or melena.  No dizziness or near syncope.  He has rare palpitations.  Past Medical History:  Diagnosis Date  . Anxiety   . Asthma    ASTHMA AS A CHILD - NO PROBLEMS NOW  . Atrial flutter with controlled response ([place]) 04/02/2015  . Bone tumor    BONE TUMORS LEFT UPPER ARM, ABOVE LEFT KNEE AND MAYBE IN JAWS --TOLD BENIGN   . Cancer [facility])    PROSTATE CANCER  . Depression   . GERD (gastroesophageal reflux disease)    NOT A RECENT PROBLEM - BUT DID TAKE OMEPRAZOLE COUPLE OF MONTHS AGO   . Headache(784.0)    MIGRAINES  . Heart murmur    TOLD HE HAS A SLIGHT HEART MURMUR  . Hypertension   . Pain    HX OF LOWER BACK PAIN AND RUPTURED DISK 2001 -BUT NO SURGERY  . Palpitations   . Paroxysmal atrial fibrillation (HCC)   . PONV (postoperative nausea and vomiting)     Past Surgical History:  Procedure Laterality Date  . BENIGN SKIN TUMOR EXCISED LEFT ARMPIT  ? 1972    . BONE TUMOR EXCISION  1973   LEFT LEG  . HERNIA REPAIR     RIGHT ING 1968 AND LEFT ING 1994 WITH MESH  . ROBOT ASSISTED LAPAROSCOPIC RADICAL PROSTATECTOMY N/A 04/13/2013   Procedure: ROBOTIC ASSISTED LAPAROSCOPIC RADICAL PROSTATECTOMY LEVEL 3;  Surgeon: Dutch Gray, MD;  Location: [facility];  Service: Urology;  Laterality: N/A;  .  TRANSTHORACIC ECHOCARDIOGRAM  Apr 07, 2015   EF 55 and 60%. No regional WMA. Mild MR. Mildly dilated ascending aorta (4.5 mm in the aortic root). Mild RA dilation.     Current Outpatient Medications  Medication Sig Dispense Refill  . acetaminophen (TYLENOL) 325 MG tablet Take 2 tablets (650 mg total) by mouth every 4 (four) hours as needed for headache or mild pain.    Marland Kitchen apixaban (ELIQUIS) 5 MG TABS tablet Take 2.5 mg by mouth 2 (two) times daily.    . clorazepate (TRANXENE) 3.75 MG tablet Take 3.75 mg by mouth. Once to twice weekly as needed    . latanoprost (XALATAN) 0.005 % ophthalmic solution Place 1 drop into both eyes at bedtime. Alternates 1 drop in each eye daily    . metoprolol tartrate (LOPRESSOR) 50 MG tablet Take 2.5 tablets (125 mg total) by mouth in the morning, at noon, and at bedtime. 270 tablet 6  . nystatin ointment  (MYCOSTATIN) Apply 1 application topically every morning.  0   No current facility-administered medications for this visit.    Allergies:   Ibuprofen, Paxil [paroxetine hcl], and Sudafed [pseudoephedrine]    Social History:  The patient  reports that he quit smoking about 49 years ago. His smoking use included cigarettes, pipe, and cigars. He has never used smokeless tobacco. He reports current alcohol use of about 1.0 standard drinks of alcohol per week. He reports that he does not use drugs.   Family History:  The patient's family history includes Cancer in his father; Cirrhosis in his father; Healthy in his brother and sister; Heart disease in his mother; Heart failure in his mother.    ROS: All other systems are reviewed and negative. Unless otherwise mentioned in H&P    PHYSICAL EXAM: VS:  BP 110/68   Pulse (!) 54   Ht 6\' 8"  (2.032 m)   Wt 207 lb 6.4 oz (94.1 kg)   BMI 22.78 kg/m  , BMI Body mass index is 22.78 kg/m. GEN: Well nourished, well developed, in no acute distress HEENT: normal, right eye conjunctivitis slightly red with a stye noted in the lower eyelid on the right. Neck: no JVD, carotid bruits, or masses Cardiac: RRR, bradycardic; no murmurs, rubs, or gallops,no edema  Respiratory:  Clear to auscultation bilaterally, normal work of breathing GI: soft, nontender, nondistended, + BS MS: no deformity or atrophy Skin: warm and dry, no rash Neuro:  Strength and sensation are intact Psych: euthymic mood, full affect   EKG: Sinus bradycardia heart rate of 54 bpm with nonspecific intraventricular conduction delay.  Recent Labs: No results found for requested labs within last 8760 hours.    Lipid Panel    Component Value Date/Time   CHOL 178 April 07, 2015 0408   TRIG 57 2015/04/07 0408   HDL 62 04-07-2015 0408   CHOLHDL 2.9 04/07/15 0408   VLDL 11 04/07/15 0408   LDLCALC 105 (H) 2015-04-07 0408      Wt Readings from Last 3 Encounters:  01/22/20 207 lb  6.4 oz (94.1 kg)  06/19/19 193 lb (87.5 kg)  04/21/18 198 lb 3.2 oz (89.9 kg)      Other studies Reviewed: Echocardiogram 07-Apr-2015  Left ventricle: The cavity size was normal. Systolic function was normal. The estimated ejection fraction was in the range of 55% to 60%. Wall motion was normal; there were no regional wall motion abnormalities. - Aorta: Aortic root dimension: 45 mm (ED). - Ascending aorta: The ascending aorta was mildly dilated. - Mitral  valve: There was mild regurgitation. - Right ventricle: The cavity size was mildly dilated. Wall thickness was normal. - Right atrium: The atrium was mildly dilated.   ASSESSMENT AND PLAN:  1.  Paroxysmal atrial fibrillation: I am concerned about him manipulating his medications without letting us know.  He feels he is on too much medication and does not like bleeding if he cuts himself he believes it is excessive.  On review of his EKG he is in normal sinus rhythm/sinus bradycardia.  Does not appear that he needs to be on 150 mg 3 times daily as he is already bradycardic on the lower dose.  For now I will leave him at that dose.  We could potentially go down to 100 mg 3 times daily if he becomes symptomatic concerning dizziness or hypotension.  Concerning Eliquis, I have talked to him about the dangers of not taking adequate dose, and possibility of having a CVA.  He states he understands this but he is not happy with the potential for bleeding especially when he cuts himself.  I have advised him to please take the medications as directed but he is resistant to doing so.  I will order BMET and a CBC.  He was reluctant to have this done as he was afraid it would decrease his immunity by removing blood from his system after having Covid shot.  I have explained to him and reassured him that this would not be the case.  He was willing to proceed with labs.  2.  Hypertension: Blood pressure is well controlled currently.  No changes in  his medications.  I doubt that he would accept any new medications or any adjustments at this time.  He again is recommended take medications as directed.  3. Medical Non-compliance: As above.   Labs/ tests ordered today include: BMET, CBC.  Current medicines are reviewed at length with the patient today.  I have spent 25 minutes dedicated to the care of this patient on the date of this encounter to include pre-visit review of records, assessment, management and diagnostic testing,with shared decision making.  Tommy [name], ANP, AACC   01/22/2020 2:49 PM    [facility] [place] [address] Office [phone] Fax [phone]  Notice: This dictation was prepared with Dragon dictation along with smaller phrase technology. Any transcriptional errors that result from this process are unintentional and may not be corrected upon review.

## 2020-01-22 ENCOUNTER — Encounter: Payer: Self-pay | Admitting: Adult Health

## 2020-01-22 ENCOUNTER — Other Ambulatory Visit: Payer: Self-pay

## 2020-01-22 ENCOUNTER — Ambulatory Visit: Payer: Medicare HMO | Admitting: Adult Health

## 2020-01-22 VITALS — BP 110/68 | HR 54 | Ht >= 80 in | Wt 207.4 lb

## 2020-01-22 DIAGNOSIS — Z79899 Other long term (current) drug therapy: Secondary | ICD-10-CM | POA: Diagnosis not present

## 2020-01-22 DIAGNOSIS — Z9114 Patient's other noncompliance with medication regimen: Secondary | ICD-10-CM | POA: Diagnosis not present

## 2020-01-22 DIAGNOSIS — I480 Paroxysmal atrial fibrillation: Secondary | ICD-10-CM

## 2020-01-22 DIAGNOSIS — I10 Essential (primary) hypertension: Secondary | ICD-10-CM

## 2020-01-22 MED ORDER — METOPROLOL TARTRATE 50 MG PO TABS: 125.0000 mg | ORAL_TABLET | Freq: Three times a day (TID) | ORAL | 6 refills | Status: DC

## 2020-01-22 NOTE — Patient Instructions (Signed)
Medication Instructions:  Continue current medications  *If you need a refill on your cardiac medications before your next appointment, please call your pharmacy*   Lab Work: CBC and BMP  If you have labs (blood work) drawn today and your tests are completely normal, you will receive your results only by: Marland Kitchen MyChart Message (if you have MyChart) OR . A paper copy in the mail If you have any lab test that is abnormal or we need to change your treatment, we will call you to review the results.   Testing/Procedures: None ordered   Follow-Up: At [facility], you and your health needs are our priority.  As part of our continuing mission to provide you with exceptional heart care, we have created designated Provider Care Teams.  These Care Teams include your primary Cardiologist (physician) and Advanced Practice Providers (APPs -  Physician Assistants and Nurse Practitioners) who all work together to provide you with the care you need, when you need it.  We recommend signing up for the patient portal called "MyChart".  Sign up information is provided on this After Visit Summary.  MyChart is used to connect with patients for Virtual Visits (Telemedicine).  Patients are able to view lab/test results, encounter notes, upcoming appointments, etc.  Non-urgent messages can be sent to your provider as well.   To learn more about what you can do with MyChart, go to [email or web address].    Your next appointment:   6 month(s)  The format for your next appointment:   In Person  Provider:   You may see Glenetta Hew, MD or one of the following Advanced Practice Providers on your designated Care Team:    Rosaria Ferries, PA-C  Jory Sims, DNP, ANP  Cadence Kathlen Mody, NP

## 2020-01-23 LAB — CBC
Hematocrit: 39.5 % (ref 37.5–51.0)
Hemoglobin: 13.2 g/dL (ref 13.0–17.7)
MCH: 31.7 pg (ref 26.6–33.0)
MCHC: 33.4 g/dL (ref 31.5–35.7)
MCV: 95 fL (ref 79–97)
Platelets: 124 10*3/uL — ABNORMAL LOW (ref 150–450)
RBC: 4.17 x10E6/uL (ref 4.14–5.80)
RDW: 11.8 % (ref 11.6–15.4)
WBC: 4.6 10*3/uL (ref 3.4–10.8)

## 2020-01-23 LAB — BASIC METABOLIC PANEL
BUN/Creatinine Ratio: 18 (ref 10–24)
BUN: 16 mg/dL (ref 8–27)
CO2: 29 mmol/L (ref 20–29)
Calcium: 9.2 mg/dL (ref 8.6–10.2)
Chloride: 101 mmol/L (ref 96–106)
Creatinine, Ser: 0.88 mg/dL (ref 0.76–1.27)
GFR calc Af Amer: 103 mL/min/{1.73_m2} (ref >59)
GFR calc non Af Amer: 90 mL/min/{1.73_m2} (ref >59)
Glucose: 64 mg/dL — ABNORMAL LOW (ref 65–99)
Potassium: 4.2 mmol/L (ref 3.5–5.2)
Sodium: 142 mmol/L (ref 134–144)

## 2020-03-20 ENCOUNTER — Other Ambulatory Visit: Payer: Self-pay | Admitting: Cardiology

## 2020-03-20 NOTE — Telephone Encounter (Signed)
Prescription refill request for Eliquis received. Indication: Atrial Fibrillation  Last office visit: Jory Sims 01/22/2020 Scr: 0.88 01/22/2020 Age: 69  Weight: 94.1 kg  Prescription refilled

## 2020-07-03 DIAGNOSIS — Z0000 Encounter for general adult medical examination without abnormal findings: Secondary | ICD-10-CM | POA: Diagnosis not present

## 2020-07-03 DIAGNOSIS — Z23 Encounter for immunization: Secondary | ICD-10-CM | POA: Diagnosis not present

## 2020-07-03 DIAGNOSIS — I4891 Unspecified atrial fibrillation: Secondary | ICD-10-CM | POA: Diagnosis not present

## 2020-07-03 DIAGNOSIS — I340 Nonrheumatic mitral (valve) insufficiency: Secondary | ICD-10-CM | POA: Diagnosis not present

## 2020-07-03 DIAGNOSIS — K219 Gastro-esophageal reflux disease without esophagitis: Secondary | ICD-10-CM | POA: Diagnosis not present

## 2020-07-03 DIAGNOSIS — F321 Major depressive disorder, single episode, moderate: Secondary | ICD-10-CM | POA: Diagnosis not present

## 2020-07-03 DIAGNOSIS — Z13220 Encounter for screening for lipoid disorders: Secondary | ICD-10-CM | POA: Diagnosis not present

## 2020-07-15 DIAGNOSIS — Z23 Encounter for immunization: Secondary | ICD-10-CM | POA: Diagnosis not present

## 2020-07-15 DIAGNOSIS — J45909 Unspecified asthma, uncomplicated: Secondary | ICD-10-CM | POA: Diagnosis not present

## 2020-07-15 DIAGNOSIS — K219 Gastro-esophageal reflux disease without esophagitis: Secondary | ICD-10-CM | POA: Diagnosis not present

## 2020-07-15 DIAGNOSIS — F419 Anxiety disorder, unspecified: Secondary | ICD-10-CM | POA: Diagnosis not present

## 2020-07-15 DIAGNOSIS — Z0000 Encounter for general adult medical examination without abnormal findings: Secondary | ICD-10-CM | POA: Diagnosis not present

## 2020-07-15 DIAGNOSIS — F321 Major depressive disorder, single episode, moderate: Secondary | ICD-10-CM | POA: Diagnosis not present

## 2020-07-15 DIAGNOSIS — I4891 Unspecified atrial fibrillation: Secondary | ICD-10-CM | POA: Diagnosis not present

## 2020-07-15 DIAGNOSIS — I340 Nonrheumatic mitral (valve) insufficiency: Secondary | ICD-10-CM | POA: Diagnosis not present

## 2020-07-25 ENCOUNTER — Ambulatory Visit: Payer: Medicare HMO | Admitting: Cardiology

## 2020-07-25 ENCOUNTER — Other Ambulatory Visit: Payer: Self-pay

## 2020-07-25 ENCOUNTER — Encounter: Payer: Self-pay | Admitting: Cardiology

## 2020-07-25 VITALS — BP 100/71 | HR 92 | Temp 95.4°F | Ht >= 80 in | Wt 201.6 lb

## 2020-07-25 DIAGNOSIS — Z7901 Long term (current) use of anticoagulants: Secondary | ICD-10-CM

## 2020-07-25 DIAGNOSIS — F419 Anxiety disorder, unspecified: Secondary | ICD-10-CM | POA: Diagnosis not present

## 2020-07-25 DIAGNOSIS — I480 Paroxysmal atrial fibrillation: Secondary | ICD-10-CM | POA: Diagnosis not present

## 2020-07-25 DIAGNOSIS — I10 Essential (primary) hypertension: Secondary | ICD-10-CM | POA: Diagnosis not present

## 2020-07-25 DIAGNOSIS — I878 Other specified disorders of veins: Secondary | ICD-10-CM

## 2020-07-25 NOTE — Patient Instructions (Addendum)

## 2020-07-25 NOTE — Progress Notes (Signed)
Primary Care Provider: Merrilee Seashore, MD Cardiologist: No primary care provider on file. Electrophysiologist: None  Clinic Note: Chief Complaint  Patient presents with  . Follow-up    6 months  . Atrial Fibrillation    He is still self adjusting his medications.    HPI:    Tommy [name] is a 69 y.o. male with a PMH below who presents today for 29-monthfollow-up for PAF. ->  Metoprolol but he takes standing with as needed dosing along with Eliquis for anticoagulation.  Has been taking 100 mg 3 times daily and occasional 150 mg 3 times daily of metoprolol. Now taking 3, 2.5 & 3.    Problem List Items Addressed This Visit    PAF (paroxysmal atrial fibrillation) ([place]; CHA2DS2-VASc Score 1; ~ on Eliquis (Chronic)      Essential hypertension (Chronic)      Current use of long term anticoagulation (Chronic)      Venous stasis of both lower extremities (Chronic)      Anxiety (Chronic)        MJACOBE STUDYwas last seen on Jan 22, 2020 by KBunnie Domino NP.  I have not seen him since August 2019.  KBunnie Domino NP saw him on June 19, 2019 as well. ->  He had reduced his dose of metoprolol to 100 mg 3 times daily and taking extra doses as needed for fast heart rates.  He has been taking his Eliquis once a day, we convince him to take it twice a day.  He also adjusted his metoprolol to be 125 mg 3 times daily with an extra tablet for palpitations.  He says that his breakthrough spells have not been very frequent. ->  He remained hesitant to continue taking Eliquis as directed.  He was also reluctant to have blood work drawn (was fearful that this would cause him to lose his immunity from COVID-19).  BP well controlled.  Recent Hospitalizations: None  Reviewed  CV studies:    The following studies were reviewed today: (if available, images/films reviewed: From Epic Chart or Care Everywhere) . None:  Interval History:   Tommy WIKreturns here today  overall doing okay.  He seems to be doing fine overall.  He said that his PCP recently started him on sertraline for anxiety, and he says are doing much better with this.  He does not have as much of the fearfulness of concern.  He subsequently decided to go and take his Eliquis as directed.  He has been taking his metoprolol closer to what I originally scheduled being 150 mg morning and evening with 125 mg during the day. About 4 5 months ago he felt that his heart rates were going up and getting up with more fast.  He also felt his blood pressure being higher so he increased it from the 125 mg 3 times daily.  Since doing so, he really has not had any palpitation.  He did not tolerate 150 3 times daily, indicating that that additional 25 during the day made him feel too weak and tired.  He has not had any real syncope or near syncope, but has had some intermittent dizziness.  When his blood pressures get low he gets a little "foggy".  He basically will adjust his medications according to parent.  The pressure today seems to be fine for him.  He is not noting any dizziness.  He does not have any signs or symptoms of chronotropic  incompetence, he indicates that only when he is climbing stairs while carrying groceries or luggage.  He has trivial swelling near the end of the day.  Goes down to put his feet up.  He is happy at work, and actually has met a lady friend and is open to potentially pursuing a relationship with her.  CV Review of Symptoms (Summary): positive for - dyspnea on exertion, palpitations and Shortness of breath is with significant exertion, not routine activity.  Palpitations are rare, and very well controlled with beta-blocker; He has noted problems with maintaining erection. negative for - chest pain, edema, orthopnea, paroxysmal nocturnal dyspnea, shortness of breath or Syncope/near syncope although he has had some dizziness.  TIA/amaurosis fugax, claudication  The patient does not  have symptoms concerning for COVID-19 infection (fever, chills, cough, or new shortness of breath).   REVIEWED OF SYSTEMS   Review of Systems  Constitutional: Negative for malaise/fatigue and weight loss.  HENT: Negative for congestion and nosebleeds.   Respiratory: Negative for cough, hemoptysis and shortness of breath.   Gastrointestinal: Negative for blood in stool and melena.  Genitourinary: Negative for hematuria.  Musculoskeletal: Negative for joint pain.  Neurological: Positive for dizziness (Occasionally.  He usually will adjust his beta-blocker based on this.). Negative for weakness.  Endo/Heme/Allergies: Bruises/bleeds easily.  Psychiatric/Behavioral: Negative for memory loss. The patient is nervous/anxious (Much better). The patient does not have insomnia.        Recently started sertraline, this is really helped his/almost OCD.    I have reviewed and (if needed) personally updated the patient's problem list, medications, allergies, past medical and surgical history, social and family history.   PAST MEDICAL HISTORY   Past Medical History:  Diagnosis Date  . Anxiety   . Asthma    ASTHMA AS A CHILD - NO PROBLEMS NOW  . Atrial flutter with controlled response ([place]) 04/02/2015  . Bone tumor    BONE TUMORS LEFT UPPER ARM, ABOVE LEFT KNEE AND MAYBE IN JAWS --TOLD BENIGN   . Cancer [facility])    PROSTATE CANCER  . Depression   . GERD (gastroesophageal reflux disease)    NOT A RECENT PROBLEM - BUT DID TAKE OMEPRAZOLE COUPLE OF MONTHS AGO   . Headache(784.0)    MIGRAINES  . Heart murmur    TOLD HE HAS A SLIGHT HEART MURMUR  . Hypertension   . Pain    HX OF LOWER BACK PAIN AND RUPTURED DISK 2001 -BUT NO SURGERY  . Palpitations   . Paroxysmal atrial fibrillation ([facility])   . PONV (postoperative nausea and vomiting)     PAST SURGICAL HISTORY   Past Surgical History:  Procedure Laterality Date  . BENIGN SKIN TUMOR EXCISED LEFT ARMPIT  ? 1972    . BONE TUMOR EXCISION  1973    LEFT LEG  . HERNIA REPAIR     RIGHT ING 1968 AND LEFT ING 1994 WITH MESH  . ROBOT ASSISTED LAPAROSCOPIC RADICAL PROSTATECTOMY N/A 04/13/2013   Procedure: ROBOTIC ASSISTED LAPAROSCOPIC RADICAL PROSTATECTOMY LEVEL 3;  Surgeon: Dutch Gray, MD;  Location: [facility];  Service: Urology;  Laterality: N/A;  . TRANSTHORACIC ECHOCARDIOGRAM  04/03/2015   EF 55 and 60%. No regional WMA. Mild MR. Mildly dilated ascending aorta (4.5 mm in the aortic root). Mild RA dilation.    Immunization History  Administered Date(s) Administered  . PFIZER SARS-COV-2 Vaccination 11/23/2019, 12/19/2019    MEDICATIONS/ALLERGIES   Current Meds  Medication Sig  . acetaminophen (TYLENOL) 325 MG tablet Take  2 tablets (650 mg total) by mouth every 4 (four) hours as needed for headache or mild pain.  . clorazepate (TRANXENE) 3.75 MG tablet Take 3.75 mg by mouth. Once to twice weekly as needed  . ELIQUIS 5 MG TABS tablet TAKE 1 TABLET BY MOUTH TWICE DAILY.  Marland Kitchen latanoprost (XALATAN) 0.005 % ophthalmic solution Place 1 drop into both eyes at bedtime. Alternates 1 drop in each eye daily  . metoprolol tartrate (LOPRESSOR) 50 MG tablet Take 2.5 tablets (125 mg total) by mouth in the morning, at noon, and at bedtime.  Marland Kitchen nystatin ointment (MYCOSTATIN) Apply 1 application topically every morning.    Allergies  Allergen Reactions  . Ibuprofen     Sleepwalking  . Paxil [Paroxetine Hcl] Other (See Comments)    Droopy face/disoriented.  Ebbie Ridge [Pseudoephedrine]     Causes a "rebound headache"     SOCIAL HISTORY/FAMILY HISTORY   Reviewed in Epic:  Pertinent findings: -> Considering starting a new relationship.  OBJCTIVE -PE, EKG, labs   Wt Readings from Last 3 Encounters:  07/25/20 201 lb 9.6 oz (91.4 kg)  01/22/20 207 lb 6.4 oz (94.1 kg)  06/19/19 193 lb (87.5 kg)    Physical Exam: BP 100/71   Pulse 92   Temp (!) 95.4 F (35.2 C)   Ht 6' 8"  (2.032 m)   Wt 201 lb 9.6 oz (91.4 kg)   SpO2 97%   BMI 22.15 kg/m   Physical Exam Vitals reviewed.  Constitutional:      General: He is not in acute distress.    Appearance: Normal appearance. He is not ill-appearing or toxic-appearing.     Comments: Very tall, slender gentleman.  Well-groomed.  Healthy-appearing.  HENT:     Head: Normocephalic and atraumatic.  Neck:     Vascular: No carotid bruit.  Cardiovascular:     Rate and Rhythm: Normal rate and regular rhythm.     Pulses: Normal pulses.     Heart sounds: Normal heart sounds. No murmur heard.  No friction rub. No gallop.   Pulmonary:     Effort: Pulmonary effort is normal. No respiratory distress.     Breath sounds: Normal breath sounds.  Chest:     Chest wall: No tenderness.  Musculoskeletal:        General: No swelling. Normal range of motion.     Cervical back: Normal range of motion and neck supple.  Skin:    General: Skin is warm and dry.  Neurological:     General: No focal deficit present.     Mental Status: He is alert and oriented to person, place, and time.  Psychiatric:        Mood and Affect: Mood normal.        Behavior: Behavior normal.        Thought Content: Thought content normal.        Judgment: Judgment normal.     Adult ECG Report N/A  Recent Labs: July 03, 2020: TG 31, HDL 63, LDL 76.  Lab Results  Component Value Date   CHOL 178 04/03/2015   HDL 62 04/03/2015   LDLCALC 105 (H) 04/03/2015   TRIG 57 04/03/2015   CHOLHDL 2.9 04/03/2015   Lab Results  Component Value Date   CREATININE 0.88 01/22/2020   BUN 16 01/22/2020   NA 142 01/22/2020   K 4.2 01/22/2020   CL 101 01/22/2020   CO2 29 01/22/2020   Lab Results  Component Value Date  WBC 4.6 01/22/2020   HGB 13.2 01/22/2020   HCT 39.5 01/22/2020   MCV 95 01/22/2020   PLT 124 (L) 01/22/2020    Lab Results  Component Value Date   TSH 0.847 04/02/2015    ASSESSMENT/PLAN   69 year old gentleman with paroxysmal atrial fibrillation.  Problem List Items Addressed This Visit    PAF  (paroxysmal atrial fibrillation) ([place]); CHA2DS2-VASc Score 1; ~ on Eliquis - Primary (Chronic)    As far as I can tell, he has not truthfully had a recurrence.  He is quite symptomatic when he does have.  He feels that his heart rate goes up until he adjusts his beta-blocker dose.  I am reluctant to have him on the prescription current dose, but he seems doing okay with it, tolerating her blood pressure.  He is taking Eliquis correctly.  I reminded him, that only if he is taking his Eliquis correctly would be be able to cardiovert him urgently if he has breakthroughs.  For now he will continue to adjust his beta-blocker dose a little bit.  I did tell him that was concerned about low blood pressure.  He should reduce his dose if he is dizzy.  He needs to take his time when changing from lying down to standing.      Essential hypertension (Chronic)    Certainly not a problem, especially with the amount of beta-blocker that he is on.      Current use of long term anticoagulation (Chronic)    We talked about whether or not he would need to be on DOAC versus aspirin.  He is concerned with the lack of protection from aspirin and therefore was agreeable to doing well active, but has not always been taking Eliquis as prescribed.  I reminded him that not taking it correctly actually significantly reduces the benefit.  If he does continue to take it correctly, then we could potentially doing as needed cardioversion, otherwise that would be delayed.      Venous stasis of both lower extremities (Chronic)    Relatively stable.  I still recommend that he wear support stockings especially when he is at work on his feet all day long.  His blood pressure probably will tolerate using a diuretic for now.  Continue to monitor.  Suggested intermittent foot elevation.      Anxiety (Chronic)    Zoloft/sertraline seems to really have helped.  This is being managed by PCP.          COVID-19 Education: The signs  and symptoms of COVID-19 were discussed with the patient and how to seek care for testing (follow up with PCP or arrange E-visit).   The importance of social distancing and COVID-19 vaccination was discussed today. 2 min The patient is practicing social distancing & Masking.   I spent a total of 36 minutes with the patient spent in direct patient consultation. ->  Tommy [name] is very anxious, he also tends to like to adjust his medications.  We went through different parameters from which he could do so.  I did stress the importance of taking Eliquis correctly.  I also voiced my concern that beta-blocker is taking, and indicated that at any time, if he is dizzy, he should significant reduce the upcoming dose of beta-blocker.  Additional time spent with chart review  / charting (studies, outside notes, etc): 10 Total Time: 46 min   Current medicines are reviewed at length with the patient today.  (+/- concerns) n/a  This  visit occurred during the SARS-CoV-2 public health emergency.  Safety protocols were in place, including screening questions prior to the visit, additional usage of staff PPE, and extensive cleaning of exam room while observing appropriate contact time as indicated for disinfecting solutions.  Notice: This dictation was prepared with Dragon dictation along with smaller phrase technology. Any transcriptional errors that result from this process are unintentional and may not be corrected upon review.  Patient Instructions / Medication Changes & Studies & Tests Ordered   Patient Instructions  Medication Instructions:  No changes  *If you need a refill on your cardiac medications before your next appointment, please call your pharmacy*   Lab Work: Not needed If you have labs (blood work) drawn today and your tests are completely normal, you will receive your results only by: Marland Kitchen MyChart Message (if you have MyChart) OR . A paper copy in the mail If you have any lab test that is  abnormal or we need to change your treatment, we will call you to review the results.   Testing/Procedures: Not needed   Follow-Up: At [facility], you and your health needs are our priority.  As part of our continuing mission to provide you with exceptional heart care, we have created designated Provider Care Teams.  These Care Teams include your primary Cardiologist (physician) and Advanced Practice Providers (APPs -  Physician Assistants and Nurse Practitioners) who all work together to provide you with the care you need, when you need it.  We recommend signing up for the patient portal called "MyChart".  Sign up information is provided on this After Visit Summary.  MyChart is used to connect with patients for Virtual Visits (Telemedicine).  Patients are able to view lab/test results, encounter notes, upcoming appointments, etc.  Non-urgent messages can be sent to your provider as well.   To learn more about what you can do with MyChart, go to [email or web address].    Your next appointment:   12 month(s)  The format for your next appointment:   In Person  Provider:   Glenetta Hew, MD   Studies Ordered:   No orders of the defined types were placed in this encounter.    Glenetta Hew, M.D., M.S. Interventional Cardiologist   Pager # [phone] Phone # [phone] [address] [place], [place] [address]   Thank you for choosing Heartcare at [facility]!!

## 2020-08-03 ENCOUNTER — Encounter: Payer: Self-pay | Admitting: Cardiology

## 2020-08-03 NOTE — Assessment & Plan Note (Signed)
As far as I can tell, he has not truthfully had a recurrence.  He is quite symptomatic when he does have.  He feels that his heart rate goes up until he adjusts his beta-blocker dose.  I am reluctant to have him on the prescription current dose, but he seems doing okay with it, tolerating her blood pressure.  He is taking Eliquis correctly.  I reminded him, that only if he is taking his Eliquis correctly would be be able to cardiovert him urgently if he has breakthroughs.  For now he will continue to adjust his beta-blocker dose a little bit.  I did tell him that was concerned about low blood pressure.  He should reduce his dose if he is dizzy.  He needs to take his time when changing from lying down to standing.

## 2020-08-03 NOTE — Assessment & Plan Note (Addendum)
Relatively stable.  I still recommend that he wear support stockings especially when he is at work on his feet all day long.  His blood pressure probably will tolerate using a diuretic for now.  Continue to monitor.  Suggested intermittent foot elevation.

## 2020-08-03 NOTE — Assessment & Plan Note (Signed)
Certainly not a problem, especially with the amount of beta-blocker that he is on.

## 2020-08-03 NOTE — Assessment & Plan Note (Signed)
We talked about whether or not he would need to be on DOAC versus aspirin.  He is concerned with the lack of protection from aspirin and therefore was agreeable to doing well active, but has not always been taking Eliquis as prescribed.  I reminded him that not taking it correctly actually significantly reduces the benefit.  If he does continue to take it correctly, then we could potentially doing as needed cardioversion, otherwise that would be delayed.

## 2020-08-03 NOTE — Assessment & Plan Note (Addendum)
Zoloft/sertraline seems to really have helped.  This is being managed by PCP.

## 2020-08-12 DIAGNOSIS — Z1211 Encounter for screening for malignant neoplasm of colon: Secondary | ICD-10-CM | POA: Diagnosis not present

## 2020-08-12 DIAGNOSIS — I4891 Unspecified atrial fibrillation: Secondary | ICD-10-CM | POA: Diagnosis not present

## 2020-08-12 DIAGNOSIS — R197 Diarrhea, unspecified: Secondary | ICD-10-CM | POA: Diagnosis not present

## 2020-09-11 DIAGNOSIS — C61 Malignant neoplasm of prostate: Secondary | ICD-10-CM | POA: Diagnosis not present

## 2020-09-11 DIAGNOSIS — Z8546 Personal history of malignant neoplasm of prostate: Secondary | ICD-10-CM | POA: Diagnosis not present

## 2020-09-11 DIAGNOSIS — N5201 Erectile dysfunction due to arterial insufficiency: Secondary | ICD-10-CM | POA: Diagnosis not present

## 2020-09-11 DIAGNOSIS — N393 Stress incontinence (female) (male): Secondary | ICD-10-CM | POA: Diagnosis not present

## 2020-09-19 DIAGNOSIS — T148XXA Other injury of unspecified body region, initial encounter: Secondary | ICD-10-CM | POA: Diagnosis not present

## 2020-09-25 ENCOUNTER — Other Ambulatory Visit: Payer: Self-pay | Admitting: Adult Health

## 2020-10-28 ENCOUNTER — Other Ambulatory Visit: Payer: Self-pay | Admitting: Cardiology

## 2021-02-03 ENCOUNTER — Other Ambulatory Visit: Payer: Self-pay | Admitting: Cardiology

## 2021-04-20 DIAGNOSIS — F419 Anxiety disorder, unspecified: Secondary | ICD-10-CM | POA: Diagnosis not present

## 2021-04-20 DIAGNOSIS — I4892 Unspecified atrial flutter: Secondary | ICD-10-CM | POA: Diagnosis not present

## 2021-04-20 DIAGNOSIS — K219 Gastro-esophageal reflux disease without esophagitis: Secondary | ICD-10-CM | POA: Diagnosis not present

## 2021-04-20 DIAGNOSIS — J45909 Unspecified asthma, uncomplicated: Secondary | ICD-10-CM | POA: Diagnosis not present

## 2021-05-07 ENCOUNTER — Other Ambulatory Visit: Payer: Self-pay | Admitting: Cardiology

## 2021-05-07 NOTE — Telephone Encounter (Signed)
Rx(s) sent to pharmacy electronically.  

## 2021-05-27 DIAGNOSIS — H401131 Primary open-angle glaucoma, bilateral, mild stage: Secondary | ICD-10-CM | POA: Diagnosis not present

## 2021-06-03 DIAGNOSIS — H401131 Primary open-angle glaucoma, bilateral, mild stage: Secondary | ICD-10-CM | POA: Diagnosis not present

## 2021-06-20 DIAGNOSIS — F419 Anxiety disorder, unspecified: Secondary | ICD-10-CM | POA: Diagnosis not present

## 2021-06-20 DIAGNOSIS — K219 Gastro-esophageal reflux disease without esophagitis: Secondary | ICD-10-CM | POA: Diagnosis not present

## 2021-06-20 DIAGNOSIS — I4892 Unspecified atrial flutter: Secondary | ICD-10-CM | POA: Diagnosis not present

## 2021-06-20 DIAGNOSIS — J45909 Unspecified asthma, uncomplicated: Secondary | ICD-10-CM | POA: Diagnosis not present

## 2021-06-25 ENCOUNTER — Other Ambulatory Visit: Payer: Self-pay | Admitting: Cardiology

## 2021-06-25 DIAGNOSIS — Z7901 Long term (current) use of anticoagulants: Secondary | ICD-10-CM

## 2021-06-25 DIAGNOSIS — I10 Essential (primary) hypertension: Secondary | ICD-10-CM

## 2021-06-25 DIAGNOSIS — I480 Paroxysmal atrial fibrillation: Secondary | ICD-10-CM

## 2021-06-25 NOTE — Telephone Encounter (Signed)
Please place order for BMET and CBC for annual DOAC monitoring and advise pt to come in for labs. Ok to send in 1 month refill now if needed as pt is on correct dose given his age and weight

## 2021-06-25 NOTE — Telephone Encounter (Signed)
SENT ONE MONTH REFILL AND ORDERED LABS. INDICATED ON RX TO COMPLETE LABS PRIOR TO NEXT FILL

## 2021-06-25 NOTE — Telephone Encounter (Signed)
Overdue labs routing to pharmd pool for orders Prescription refill request for Eliquis received. Indication:afib Last office visit:[place] 07/25/20 Scr:0.880 mg/ 01/22/2020 overdue  Age: 55m Weight:31.4kg

## 2021-07-09 ENCOUNTER — Other Ambulatory Visit: Payer: Self-pay | Admitting: Cardiology

## 2021-07-15 ENCOUNTER — Telehealth: Payer: Self-pay | Admitting: Cardiology

## 2021-07-15 NOTE — Telephone Encounter (Signed)
*  STAT* If patient is at the pharmacy, call can be transferred to refill team.   1. Which medications need to be refilled? (please list name of each medication and dose if known)  metoprolol tartrate (LOPRESSOR) 50 MG tablet  Patient takes 3 tablets in the AM, 3 tablets at Noon, and 2.5 tablets at night  2. Which pharmacy/location (including street and city if local pharmacy) is medication to be sent to? [place] ([place]), [place] - [address]  3. Do they need a 30 day or 90 day supply? 30  Patient is scheduled to see Dr. Ellyn Hack 07/28/21 but will not have enough medication to last him until his appt.

## 2021-07-16 NOTE — Telephone Encounter (Signed)
Tommy [name] is calling back in regards to this due to rescheduling his appointment with a PA that caused it to be pushed out until 08/12/21. He wants to insure enough tablets will be sent to the pharmacy to last him until then due to the amount he takes on a daily bases that is listed about in the refill request.

## 2021-07-17 NOTE — Telephone Encounter (Signed)
We should be able to give a couple months of refill to last him until his appointment.  Glenetta Hew, MD

## 2021-07-18 ENCOUNTER — Telehealth: Payer: Self-pay | Admitting: Cardiology

## 2021-07-18 MED ORDER — METOPROLOL TARTRATE 50 MG PO TABS: ORAL_TABLET | ORAL | 0 refills | Status: DC

## 2021-07-18 NOTE — Telephone Encounter (Signed)
*  STAT* If patient is at the pharmacy, call can be transferred to refill team.   1. Which medications need to be refilled? (please list name of each medication and dose if known) metoprolol tartrate (LOPRESSOR) 50 MG tablet  2. Which pharmacy/location (including street and city if local pharmacy) is medication to be sent to [place] ([place]), [place] - [address]  3. Do they need a 30 day or 90 day supply? 90 day supply   PT IS COMPLETELY OUT OF THIS MEDICINE. PT IS CURRENTLY AT THE PHARMACY NOW

## 2021-07-18 NOTE — Telephone Encounter (Signed)
Informed  patient  - refilled  medications sent . Patient is aware to discuss  dosage with Denyse Amass on 08/12/21. To see if they can be simplified.

## 2021-07-18 NOTE — Telephone Encounter (Signed)
Medication was sent in today. See medication tab if needed.

## 2021-07-21 DIAGNOSIS — Z125 Encounter for screening for malignant neoplasm of prostate: Secondary | ICD-10-CM | POA: Diagnosis not present

## 2021-07-21 DIAGNOSIS — Z0000 Encounter for general adult medical examination without abnormal findings: Secondary | ICD-10-CM | POA: Diagnosis not present

## 2021-07-28 ENCOUNTER — Other Ambulatory Visit: Payer: Self-pay | Admitting: Cardiology

## 2021-07-28 ENCOUNTER — Ambulatory Visit: Payer: Medicare HMO | Admitting: Cardiology

## 2021-07-28 ENCOUNTER — Telehealth: Payer: Self-pay | Admitting: Cardiology

## 2021-07-28 DIAGNOSIS — D692 Other nonthrombocytopenic purpura: Secondary | ICD-10-CM | POA: Diagnosis not present

## 2021-07-28 DIAGNOSIS — Z125 Encounter for screening for malignant neoplasm of prostate: Secondary | ICD-10-CM | POA: Diagnosis not present

## 2021-07-28 DIAGNOSIS — Z0000 Encounter for general adult medical examination without abnormal findings: Secondary | ICD-10-CM | POA: Diagnosis not present

## 2021-07-28 DIAGNOSIS — D6869 Other thrombophilia: Secondary | ICD-10-CM | POA: Diagnosis not present

## 2021-07-28 DIAGNOSIS — I4891 Unspecified atrial fibrillation: Secondary | ICD-10-CM | POA: Diagnosis not present

## 2021-07-28 DIAGNOSIS — K219 Gastro-esophageal reflux disease without esophagitis: Secondary | ICD-10-CM | POA: Diagnosis not present

## 2021-07-28 DIAGNOSIS — N182 Chronic kidney disease, stage 2 (mild): Secondary | ICD-10-CM | POA: Diagnosis not present

## 2021-07-28 DIAGNOSIS — F419 Anxiety disorder, unspecified: Secondary | ICD-10-CM | POA: Diagnosis not present

## 2021-07-28 DIAGNOSIS — F321 Major depressive disorder, single episode, moderate: Secondary | ICD-10-CM | POA: Diagnosis not present

## 2021-07-28 MED ORDER — METOPROLOL TARTRATE 50 MG PO TABS: ORAL_TABLET | ORAL | 0 refills | Status: AC

## 2021-07-28 MED ORDER — METOPROLOL TARTRATE 50 MG PO TABS: ORAL_TABLET | ORAL | 0 refills | Status: DC

## 2021-07-28 NOTE — Telephone Encounter (Signed)
Patient is following up, stating again he is completely out of medication. Rx sent to pharmacy was for 90 tablets, but patient takes 8.5 tablets daily. He will need enough medication to last him until his appointment on 11/22 with Dr. Ellyn Hack. Please advise.   *STAT* If patient is at the pharmacy, call can be transferred to refill team.   1. Which medications need to be refilled? (please list name of each medication and dose if known)  metoprolol tartrate (LOPRESSOR) 50 MG tablet  2. Which pharmacy/location (including street and city if local pharmacy) is medication to be sent to? [place] ([place]), [place] - [address]  3. Do they need a 30 day or 90 day supply?  Enough to last until appointment on 11/22

## 2021-07-28 NOTE — Telephone Encounter (Signed)
*  STAT* If patient is at the pharmacy, call can be transferred to refill team.   1. Which medications need to be refilled? (please list name of each medication and dose if known) metoprolol tartrate (LOPRESSOR) 50 MG tablet  2. Which pharmacy/location (including street and city if local pharmacy) is medication to be sent to? [place] ([place]), [place] - [address]  3. Do they need a 30 day or 90 day supply? 90   When it was sent on the 10/28 [facility] didn't get it. Patient will be out of medication on tomorrow

## 2021-07-28 NOTE — Telephone Encounter (Signed)
Prev message

## 2021-07-29 ENCOUNTER — Telehealth: Payer: Self-pay | Admitting: Cardiology

## 2021-07-29 NOTE — Telephone Encounter (Signed)
*  STAT* If patient is at the pharmacy, call can be transferred to refill team.   1. Which medications need to be refilled? (please list name of each medication and dose if known) metoprolol tartrate (LOPRESSOR) 50 MG tablet  2. Which pharmacy/location (including street and city if local pharmacy) is medication to be sent to? [place] ([place]), [place] - [address]  3. Do they need a 30 day or 90 day supply? 30 ds

## 2021-08-05 NOTE — Telephone Encounter (Signed)
Closing note out. Medication was sent to pts pharmacy as directed.

## 2021-08-11 NOTE — Progress Notes (Signed)
Cardiology Clinic Note   Patient Name: OTHA [name] Date of Encounter: 08/12/2021  Primary Care Provider:  Merrilee Seashore, MD Primary Cardiologist:  Glenetta Hew, MD  Patient Profile    Tommy [name] presents to the clinic today for follow-up evaluation of his paroxysmal atrial fibrillation.  Past Medical History    Past Medical History:  Diagnosis Date   Anxiety    Asthma    ASTHMA AS A CHILD - NO PROBLEMS NOW   Atrial flutter with controlled response ([place]) 04/02/2015   Bone tumor    BONE TUMORS LEFT UPPER ARM, ABOVE LEFT KNEE AND MAYBE IN JAWS --TOLD BENIGN    Cancer (HCC)    PROSTATE CANCER   Depression    GERD (gastroesophageal reflux disease)    NOT A RECENT PROBLEM - BUT DID TAKE OMEPRAZOLE COUPLE OF MONTHS AGO    Headache(784.0)    MIGRAINES   Heart murmur    TOLD HE HAS A SLIGHT HEART MURMUR   Hypertension    Pain    HX OF LOWER BACK PAIN AND RUPTURED DISK 2001 -BUT NO SURGERY   Palpitations    Paroxysmal atrial fibrillation (HCC)    PONV (postoperative nausea and vomiting)    Past Surgical History:  Procedure Laterality Date   BENIGN SKIN TUMOR EXCISED LEFT ARMPIT  ? 1972     BONE TUMOR EXCISION  1973   LEFT LEG   HERNIA REPAIR     RIGHT ING 1968 AND LEFT ING 1994 WITH MESH   ROBOT ASSISTED LAPAROSCOPIC RADICAL PROSTATECTOMY N/A 04/13/2013   Procedure: ROBOTIC ASSISTED LAPAROSCOPIC RADICAL PROSTATECTOMY LEVEL 3;  Surgeon: Dutch Gray, MD;  Location: [facility];  Service: Urology;  Laterality: N/A;   TRANSTHORACIC ECHOCARDIOGRAM  04/03/2015   EF 55 and 60%. No regional WMA. Mild MR. Mildly dilated ascending aorta (4.5 mm in the aortic root). Mild RA dilation.    Allergies  Allergies  Allergen Reactions   Ibuprofen     Sleepwalking   Paxil [Paroxetine Hcl] Other (See Comments)    Droopy face/disoriented.   Sudafed [Pseudoephedrine]     Causes a "rebound headache"     History of Present Illness    [name] is has a PMH of paroxysmal  atrial fibrillation on apixaban, essential hypertension, lower extremity venous stasis, anxiety, and dizziness.  He was seen by Dr. Ellyn Hack on 07/25/2020.  During that time he reported that he was doing overall okay.  His PCP had recently started him on sertraline for anxiety and he was feeling much better.  He noted that he was not having as much fearful concern.  He was taking his apixaban as directed.  He had been taking 150 mg of metoprolol in the morning and 125 mg in the evening.  He reported that 45 minutes prior to the visit he noted increased heart rates.  He also felt that his blood pressure was higher.  He denied palpitations.  He did not tolerate metoprolol 150 mg 3 times daily.  He denied near-syncope and syncope.  He denied intermittent dizziness.  He noted that when his blood pressure was low he did feel a little foggy.  He did not have any symptoms/signs of chronotropic incompetence.  He was noted to have trivial lower extremity swelling.  He indicated that he was happy with his appointment and had met a girlfriend and was open to pursuing relationship.  He presents the clinic today for follow-up evaluation states he continues to feel well.  He is taking metoprolol 150 mg 3 times daily.  He continues to work second shift at [employer]  He notices some drying and cracking of his fingertips and between his knuckles.  We discussed strategies for preventing this.  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  On his off days he had runs errands and stays physically active.  He does notice palpitations with increased physical activity which are relieved with rest.  He reports compliance with his apixaban and denies bleeding issues.  We will continue his current medications, request his labs from his PCP, and plan follow-up in 9-12 months.  Today the denies chest pain, shortness of breath, lower extremity edema, fatigue, palpitations, melena, hematuria,  hemoptysis, diaphoresis, weakness, presyncope, syncope, orthopnea, and PND.    Home Medications    Prior to Admission medications   Medication Sig Start Date End Date Taking? Authorizing Provider  acetaminophen (TYLENOL) 325 MG tablet Take 2 tablets (650 mg total) by mouth every 4 (four) hours as needed for headache or mild pain. 04/03/15   Erlene Quan, PA-C  apixaban (ELIQUIS) 5 MG TABS tablet Take 1 tablet (5 mg total) by mouth 2 (two) times daily. LABS NEEDED FOR FURTHER REFILLS 06/25/21   Leonie Man, MD  clorazepate (TRANXENE) 3.75 MG tablet Take 3.75 mg by mouth. Once to twice weekly as needed    [provider]  latanoprost (XALATAN) 0.005 % ophthalmic solution Place 1 drop into both eyes at bedtime. Alternates 1 drop in each eye daily    [provider]  metoprolol tartrate (LOPRESSOR) 50 MG tablet TAKE 2 & 1/2 (TWO & ONE-HALF) TABLETS BY MOUTH IN THE MORNING, NOON AT BEDTIME KEEP UPCOMING APPOINTMENT 07/28/21   Leonie Man, MD  nystatin ointment (MYCOSTATIN) Apply 1 application topically every morning. 01/24/15   [provider]    Family History    Family History  Problem Relation Age of Onset   Heart disease Mother        CABG   Heart failure Mother    Cancer Father    Cirrhosis Father    Healthy Sister    Healthy Brother    He indicated that his mother is deceased. He indicated that his father is deceased. He indicated that his sister is alive. He indicated that his brother is alive.  Social History    Social History   Socioeconomic History   Marital status: Single    Spouse name: Not on file   Number of children: Not on file   Years of education: Not on file   Highest education level: Not on file  Occupational History   Not on file  Tobacco Use   Smoking status: Former    Types: Cigarettes, Pipe, Cigars    Quit date: 09/21/1970    Years since quitting: 50.9   Smokeless tobacco: Never  Substance and Sexual Activity    Alcohol use: Yes    Alcohol/week: 1.0 standard drink    Types: 1 Glasses of wine per week    Comment: Occassionally    Drug use: No   Sexual activity: Not on file  Other Topics Concern   Not on file  Social History Narrative   Single. Lives with his brother Jenny Reichmann. 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  Cardiovascular:  No chest pain, dyspnea on exertion, edema, orthopnea, palpitations, paroxysmal nocturnal dyspnea. Dermatological: No rash, lesions/masses Respiratory: No cough, dyspnea Urologic: No hematuria, dysuria Abdominal:   No nausea, vomiting, diarrhea, bright red blood per rectum, melena, or hematemesis Neurologic:  No visual changes, wkns, changes in mental status. All other systems reviewed and are otherwise negative except as noted above.  Physical Exam    VS:  Ht 6' 8" (2.032 m)   Wt 209 lb (94.8 kg)   BMI 22.96 kg/m  , BMI Body mass index is 22.96 kg/m. GEN: Well nourished, well developed, in no acute distress. HEENT: normal. Neck: Supple, no JVD, carotid bruits, or masses. Cardiac: RRR, no murmurs, rubs, or gallops. No clubbing, cyanosis, edema.  Radials/DP/PT 2+ and equal bilaterally.  Respiratory:  Respirations regular and unlabored, clear to auscultation bilaterally. GI: Soft, nontender, nondistended, BS + x 4. MS: no deformity or atrophy. Skin: warm and dry, no rash. Neuro:  Strength and sensation are intact. Psych: Normal affect.  Accessory Clinical Findings    Recent Labs: No results found for requested labs within last 8760 hours.   Recent Lipid Panel    Component Value Date/Time   CHOL 178  04/03/2015 0408   TRIG 57 04/03/2015 0408   HDL 62 04/03/2015 0408   CHOLHDL 2.9 04/03/2015 0408   VLDL 11 04/03/2015 0408   LDLCALC 105 (H) 04/03/2015 0408    ECG personally reviewed by me today-Marked sinus bradycardia with premature atrial complexes 44 bpm- No acute changes  Echocardiogram 04/03/2015  Study Conclusions   - Left ventricle: The cavity size was normal. Systolic function was    normal. The estimated ejection fraction was in the range of 55%    to 60%. Wall motion was normal; there were no regional wall    motion abnormalities.  - Aorta: Aortic root dimension: 45 mm (ED).  - Ascending aorta: The ascending aorta was mildly dilated.  - Mitral valve: There was mild regurgitation.  - Right ventricle: The cavity size was mildly dilated. Wall    thickness was normal.  - Right atrium: The atrium was mildly dilated.   Echocardiography.  M-mode, complete 2D, spectral Doppler, and color  Doppler.  Birthdate:  Patient birthdate: 06-11-1951.  Age:  Patient  is 70 yr old.  Sex:  Gender: male.    BMI: 21.6 kg/m^2.  Blood  pressure:     103/72  Patient status:  Inpatient.  Study date:  Study date: 04/03/2015. Study time: 01:03 PM.  Location:  Bedside.   Assessment & Plan   1.  Paroxysmal atrial fibrillation-EKG today shows marked sinus bradycardia with premature atrial complexes 44 bpm.  Denies recent episodes of accelerated or irregular heartbeats.  Continues to note occasional periods of palpitations. Continue apixaban, metoprolol Heart healthy low-sodium diet-salty 6 given Increase physical activity as tolerated  Essential hypertension-BP today 110/64.  Well-controlled at home. Continue metoprolol Heart healthy low-sodium diet-salty 6 given Increase physical activity as tolerated Requested labs from PCP  Lower extremity venous stasis-bilateral generalized lower extremity nonpitting edema. Heart healthy low-sodium diet-salty 6 given Increase physical activity as  tolerated Lower extremity support stockings  Anxiety-continues with sertraline.  Anxiety fairly well controlled on the medication. Continue sertraline Increase physical activity as tolerated Follows  with PCP  Disposition: Follow-up with Dr. Ellyn Hack or me in 9-12 months.  Jossie Ng. Cleaver [name]    08/12/2021, 3:21 PM [facility] [address] Office [phone] Fax [phone]  Notice: This dictation was prepared with Dragon dictation along with smaller phrase technology. Any transcriptional errors that result from this process are unintentional and may not be corrected upon review.  I spent 14 minutes examining this patient, reviewing medications, and using patient centered shared decision making involving her cardiac care.  Prior to her visit I spent greater than 20 minutes reviewing her past medical history,  medications, and prior cardiac tests.

## 2021-08-12 ENCOUNTER — Ambulatory Visit: Payer: Medicare HMO | Admitting: General Practice

## 2021-08-12 ENCOUNTER — Encounter: Payer: Self-pay | Admitting: General Practice

## 2021-08-12 ENCOUNTER — Other Ambulatory Visit: Payer: Self-pay

## 2021-08-12 VITALS — BP 110/64 | HR 44 | Ht >= 80 in | Wt 209.0 lb

## 2021-08-12 DIAGNOSIS — I480 Paroxysmal atrial fibrillation: Secondary | ICD-10-CM

## 2021-08-12 DIAGNOSIS — F419 Anxiety disorder, unspecified: Secondary | ICD-10-CM | POA: Diagnosis not present

## 2021-08-12 DIAGNOSIS — I10 Essential (primary) hypertension: Secondary | ICD-10-CM | POA: Diagnosis not present

## 2021-08-12 DIAGNOSIS — I878 Other specified disorders of veins: Secondary | ICD-10-CM

## 2021-08-12 NOTE — Patient Instructions (Signed)
Medication Instructions:  Your Physician recommend you continue on your current medication as directed.    *If you need a refill on your cardiac medications before your next appointment, please call your pharmacy*   Lab Work: None    Testing/Procedures: None    Follow-Up: At [facility], you and your health needs are our priority.  As part of our continuing mission to provide you with exceptional heart care, we have created designated Provider Care Teams.  These Care Teams include your primary Cardiologist (physician) and Advanced Practice Providers (APPs -  Physician Assistants and Nurse Practitioners) who all work together to provide you with the care you need, when you need it.  We recommend signing up for the patient portal called "MyChart".  Sign up information is provided on this After Visit Summary.  MyChart is used to connect with patients for Virtual Visits (Telemedicine).  Patients are able to view lab/test results, encounter notes, upcoming appointments, etc.  Non-urgent messages can be sent to your provider as well.   To learn more about what you can do with MyChart, go to [email or web address].    Your next appointment:   12 month(s)  The format for your next appointment:   In Person  Provider:   Glenetta Hew, MD    Other Instructions Continue with physical activity.  Fiber Content in Foods Fiber is a substance that is found in plant foods, such as fruits, vegetables, whole grains, nuts, seeds, and beans. As part of your treatment and recovery plan, your health care provider may recommend that you eat foods that have specific amounts of dietary fiber. Some conditions may require a high-fiber diet while others may require a low-fiber diet. This sheet gives you information about the dietary fiber content of some common foods. Your health care provider will tell you how much fiber you need in your diet. If you have problems or questions, contact your health care  provider or dietitian. What foods are high in fiber? Fruits Blackberries or raspberries (fresh) --  cup (75 g) has 4 g of fiber. Pear (fresh) -- 1 medium (180 g) has 5.5 g of fiber. Prunes (dried) -- 6 to 8 pieces (57-76 g) has 5 g of fiber. Apple with skin -- 1 medium (182 g) has 4.8 g of fiber. Guava -- 1 cup (128 g) has 8.9 g of fiber. Vegetables Peas (frozen) --  cup (80 g) has 4.4 g of fiber. Potato with skin (baked) -- 1 medium (173 g) has 4.4 g of fiber. Pumpkin (canned) --  cup (122 g) has 5 g of fiber. Brussels sprouts (cooked) --  cup (78 g) has 4 g of fiber. Sweet potato --  cup mashed (124 g) has 4 g of fiber. Winter squash -- 1 cup cooked (205 g) has 5.7 g of fiber. Grains Bran cereal --  cup (31 g) has 8.6 g of fiber. Bulgur (cooked) --  cup (70 g) has 4 g of fiber. Quinoa (cooked) -- 1 cup (185 g) has 5.2 g of fiber. Popcorn -- 3 cups (375 g) popped has 5.8 g of fiber. Spaghetti, whole wheat -- 1 cup (140 g) has 6 g of fiber. Meats and other proteins Pinto beans (cooked) --  cup (90 g) has 7.7 g of fiber. Lentils (cooked) --  cup (90 g) has 7.8 g of fiber. Kidney beans (canned) --  cup (92.5 g) has 5.7 g of fiber. Soybeans (canned, frozen, or fresh) --  cup (92.5 g) has 5.2 g  of fiber. Baked beans, plain or vegetarian (canned) --  cup (130 g) has 5.2 g of fiber. Garbanzo beans or chickpeas (canned) --  cup (90 g) has 6.6 g of fiber. Black beans (cooked) --  cup (86 g) has 7.5 g of fiber. White beans or navy beans (cooked) --  cup (91 g) has 9.3 g of fiber. The items listed above may not be a complete list of foods with high fiber. Actual amounts of fiber may be different depending on processing. Contact a dietitian for more information. What foods are moderate in fiber? Fruits Banana -- 1 medium (126 g) has 3.2 g of fiber. Melon -- 1 cup (155 g) has 1.4 g of fiber. Orange -- 1 small (154 g) has 3.7 g of fiber. Raisins --  cup (40 g) has 1.8 g of  fiber. Applesauce, sweetened --  cup (125 g) has 1.5 g of fiber. Blueberries (fresh) --  cup (75 g) has 1.8 g of fiber. Strawberries (fresh, sliced) -- 1 cup (009 g) has 3 g of fiber. Cherries -- 1 cup (140 g) has 2.9 g of fiber. Vegetables Broccoli (cooked) --  cup (77.5 g) has 2.1 g of fiber. Carrots (cooked) --  cup (77.5 g) has 2.2 g of fiber. Corn (canned or frozen) --  cup (82.5 g) has 2.1 g of fiber. Potatoes, mashed --  cup (105 g) has 1.6 g of fiber. Tomato -- 1 medium (62 g) has 1.5 g of fiber. Green beans (canned) --  cup (83 g) has 2 g of fiber. Squash, winter --  cup (58 g) has 1 g of fiber. Sweet potato, baked -- 1 medium (150 g) has 3 g of fiber. Cauliflower (cooked) -- 1/2 cup (90 g) has 2.3 g of fiber. Grains Long-grain brown rice (cooked) -- 1 cup (196 g) has 3.5 g of fiber. Bagel, plain -- one 4-inch (10 cm) bagel has 2 g of fiber. Instant oatmeal --  cup (120 g) has about 2 g of fiber. Macaroni noodles, enriched (cooked) -- 1 cup (140 g) has 2.5 g of fiber. Multigrain cereal --  cup (15 g) has about 2-4 g of fiber. Whole-wheat bread -- 1 slice (26 g) has 2 g of fiber. Whole-wheat spaghetti noodles --  cup (70 g) has 3.2 g of fiber. Corn tortilla -- one 6-inch (15 cm) tortilla has 1.5 g of fiber. Meats and other proteins Almonds --  cup or 1 oz (28 g) has 3.5 g of fiber. Sunflower seeds in shell --  cup or  oz (11.5 g) has 1.1 g of fiber. Vegetable or soy patty -- 1 patty (70 g) has 3.4 g of fiber. Walnuts --  cup or 1 oz (30 g) has 2 g of fiber. Flax seed -- 1 Tbsp (7 g) has 2.8 g of fiber. The items listed above may not be a complete list of foods that have moderate amounts of fiber. Actual amounts of fiber may be different depending on processing. Contact a dietitian for more information. What foods are low in fiber? Low-fiber foods contain less than 1 g of fiber per serving. They include: Fruits Fruit juice --  cup or 4 fl oz (118 mL) has 0.5 g  of fiber. Vegetables Lettuce -- 1 cup (35 g) has 0.5 g of fiber. Cucumber (slices) --  cup (60 g) has 0.3 g of fiber. Celery -- 1 stalk (40 g) has 0.1 g of fiber. Grains Flour tortilla -- one 6-inch (15 cm)  tortilla has 0.5 g of fiber. White rice (cooked) --  cup (81.5 g) has 0.3 g of fiber. Meats and other proteins Egg -- 1 large (50 g) has 0 g of fiber. Meat, poultry, or fish -- 3 oz (85 g) has 0 g of fiber. Dairy Milk -- 1 cup or 8 fl oz (237 mL) has 0 g of fiber. Yogurt -- 1 cup (245 g) has 0 g of fiber. The items listed above may not be a complete list of foods that are low in fiber. Actual amounts of fiber may be different depending on processing. Contact a dietitian for more information. Summary Fiber is a substance that is found in plant foods, such as fruits, vegetables, whole grains, nuts, seeds, and beans. As part of your treatment and recovery plan, your health care provider may recommend that you eat foods that have specific amounts of dietary fiber. This information is not intended to replace advice given to you by your health care provider. Make sure you discuss any questions you have with your health care provider. Document Revised: 01/11/2020 Document Reviewed: 01/11/2020 Elsevier Patient Education  2022 [employer].

## 2021-09-02 DIAGNOSIS — C61 Malignant neoplasm of prostate: Secondary | ICD-10-CM | POA: Diagnosis not present

## 2021-09-29 ENCOUNTER — Other Ambulatory Visit: Payer: Self-pay | Admitting: Cardiology

## 2021-09-29 NOTE — Telephone Encounter (Signed)
Prescription refill request for Eliquis received. Indication:Afib Last office visit:11/22 KMM:NOTRR Labs Age: 71 Weight:94.8 kg  Prescription refilled

## 2021-10-01 ENCOUNTER — Other Ambulatory Visit: Payer: Self-pay

## 2021-10-01 MED ORDER — APIXABAN 5 MG PO TABS: ORAL_TABLET | ORAL | 0 refills | Status: DC

## 2021-10-08 ENCOUNTER — Other Ambulatory Visit: Payer: Self-pay

## 2021-10-08 MED ORDER — APIXABAN 5 MG PO TABS: ORAL_TABLET | ORAL | 5 refills | Status: DC

## 2021-12-01 DIAGNOSIS — H401131 Primary open-angle glaucoma, bilateral, mild stage: Secondary | ICD-10-CM | POA: Diagnosis not present

## 2021-12-17 ENCOUNTER — Encounter: Payer: Self-pay | Admitting: Cardiology

## 2021-12-17 ENCOUNTER — Other Ambulatory Visit: Payer: Self-pay

## 2021-12-17 MED ORDER — APIXABAN 5 MG PO TABS: ORAL_TABLET | ORAL | 5 refills | Status: DC

## 2021-12-17 NOTE — Telephone Encounter (Signed)
Prescription refill request for Eliquis received. ?Indication:Afib ?Last office visit:11/22 ?Scr:0.7 ?Age: 71 ?Weight:94.8 kg ? ?Prescription refilled ? ?

## 2022-03-04 DIAGNOSIS — H401131 Primary open-angle glaucoma, bilateral, mild stage: Secondary | ICD-10-CM | POA: Diagnosis not present

## 2022-04-09 ENCOUNTER — Inpatient Hospital Stay: Admit: 2022-04-09 | Payer: Medicare HMO | Admitting: Surgery

## 2022-04-09 ENCOUNTER — Encounter (HOSPITAL_COMMUNITY): Payer: Self-pay

## 2022-04-09 ENCOUNTER — Observation Stay (HOSPITAL_COMMUNITY): Payer: Medicare HMO | Admitting: Anesthesiology

## 2022-04-09 ENCOUNTER — Emergency Department (HOSPITAL_COMMUNITY): Payer: Medicare HMO

## 2022-04-09 ENCOUNTER — Encounter (HOSPITAL_COMMUNITY): Admission: EM | Disposition: A | Discharge status: Home / Self Care | Payer: Self-pay | Source: Home / Self Care

## 2022-04-09 ENCOUNTER — Inpatient Hospital Stay (HOSPITAL_COMMUNITY)
Admission: EM | Admit: 2022-04-09 | Discharge: 2022-04-10 | DRG: 352 | Disposition: A | Discharge status: Home / Self Care | Payer: Medicare HMO | Attending: Surgery | Admitting: Surgery

## 2022-04-09 ENCOUNTER — Other Ambulatory Visit: Payer: Self-pay

## 2022-04-09 DIAGNOSIS — J45909 Unspecified asthma, uncomplicated: Secondary | ICD-10-CM | POA: Insufficient documentation

## 2022-04-09 DIAGNOSIS — G8929 Other chronic pain: Secondary | ICD-10-CM | POA: Diagnosis present

## 2022-04-09 DIAGNOSIS — F418 Other specified anxiety disorders: Secondary | ICD-10-CM | POA: Diagnosis not present

## 2022-04-09 DIAGNOSIS — Z888 Allergy status to other drugs, medicaments and biological substances status: Secondary | ICD-10-CM

## 2022-04-09 DIAGNOSIS — I340 Nonrheumatic mitral (valve) insufficiency: Secondary | ICD-10-CM | POA: Insufficient documentation

## 2022-04-09 DIAGNOSIS — K4090 Unilateral inguinal hernia, without obstruction or gangrene, not specified as recurrent: Secondary | ICD-10-CM

## 2022-04-09 DIAGNOSIS — D6869 Other thrombophilia: Secondary | ICD-10-CM | POA: Insufficient documentation

## 2022-04-09 DIAGNOSIS — Z87891 Personal history of nicotine dependence: Secondary | ICD-10-CM | POA: Diagnosis not present

## 2022-04-09 DIAGNOSIS — F32A Depression, unspecified: Secondary | ICD-10-CM | POA: Diagnosis not present

## 2022-04-09 DIAGNOSIS — K2970 Gastritis, unspecified, without bleeding: Secondary | ICD-10-CM | POA: Diagnosis not present

## 2022-04-09 DIAGNOSIS — R110 Nausea: Secondary | ICD-10-CM | POA: Diagnosis not present

## 2022-04-09 DIAGNOSIS — G8918 Other acute postprocedural pain: Secondary | ICD-10-CM | POA: Diagnosis not present

## 2022-04-09 DIAGNOSIS — Z7901 Long term (current) use of anticoagulants: Secondary | ICD-10-CM

## 2022-04-09 DIAGNOSIS — K4030 Unilateral inguinal hernia, with obstruction, without gangrene, not specified as recurrent: Secondary | ICD-10-CM | POA: Diagnosis not present

## 2022-04-09 DIAGNOSIS — D692 Other nonthrombocytopenic purpura: Secondary | ICD-10-CM | POA: Insufficient documentation

## 2022-04-09 DIAGNOSIS — I480 Paroxysmal atrial fibrillation: Secondary | ICD-10-CM | POA: Diagnosis not present

## 2022-04-09 DIAGNOSIS — Z8546 Personal history of malignant neoplasm of prostate: Secondary | ICD-10-CM

## 2022-04-09 DIAGNOSIS — K4031 Unilateral inguinal hernia, with obstruction, without gangrene, recurrent: Principal | ICD-10-CM

## 2022-04-09 DIAGNOSIS — R1084 Generalized abdominal pain: Secondary | ICD-10-CM | POA: Diagnosis not present

## 2022-04-09 DIAGNOSIS — I10 Essential (primary) hypertension: Secondary | ICD-10-CM | POA: Diagnosis not present

## 2022-04-09 DIAGNOSIS — F321 Major depressive disorder, single episode, moderate: Secondary | ICD-10-CM | POA: Insufficient documentation

## 2022-04-09 DIAGNOSIS — Z9079 Acquired absence of other genital organ(s): Secondary | ICD-10-CM

## 2022-04-09 DIAGNOSIS — Z79899 Other long term (current) drug therapy: Secondary | ICD-10-CM

## 2022-04-09 DIAGNOSIS — K219 Gastro-esophageal reflux disease without esophagitis: Secondary | ICD-10-CM | POA: Diagnosis present

## 2022-04-09 DIAGNOSIS — Z886 Allergy status to analgesic agent status: Secondary | ICD-10-CM

## 2022-04-09 DIAGNOSIS — N182 Chronic kidney disease, stage 2 (mild): Secondary | ICD-10-CM | POA: Insufficient documentation

## 2022-04-09 DIAGNOSIS — K6389 Other specified diseases of intestine: Secondary | ICD-10-CM | POA: Diagnosis not present

## 2022-04-09 DIAGNOSIS — I872 Venous insufficiency (chronic) (peripheral): Secondary | ICD-10-CM | POA: Insufficient documentation

## 2022-04-09 DIAGNOSIS — R231 Pallor: Secondary | ICD-10-CM | POA: Diagnosis not present

## 2022-04-09 DIAGNOSIS — R42 Dizziness and giddiness: Secondary | ICD-10-CM | POA: Diagnosis not present

## 2022-04-09 DIAGNOSIS — F419 Anxiety disorder, unspecified: Secondary | ICD-10-CM | POA: Diagnosis not present

## 2022-04-09 DIAGNOSIS — K56609 Unspecified intestinal obstruction, unspecified as to partial versus complete obstruction: Secondary | ICD-10-CM | POA: Diagnosis not present

## 2022-04-09 DIAGNOSIS — R109 Unspecified abdominal pain: Secondary | ICD-10-CM | POA: Diagnosis not present

## 2022-04-09 HISTORY — PX: INGUINAL HERNIA REPAIR: SHX194

## 2022-04-09 LAB — CBC WITH DIFFERENTIAL/PLATELET
Abs Immature Granulocytes: 0.03 10*3/uL (ref 0.00–0.07)
Basophils Absolute: 0 10*3/uL (ref 0.0–0.1)
Basophils Relative: 0 %
Eosinophils Absolute: 0.1 10*3/uL (ref 0.0–0.5)
Eosinophils Relative: 1 %
HCT: 42.1 % (ref 39.0–52.0)
Hemoglobin: 14.4 g/dL (ref 13.0–17.0)
Immature Granulocytes: 0 %
Lymphocytes Relative: 12 %
Lymphs Abs: 1.2 10*3/uL (ref 0.7–4.0)
MCH: 31.6 pg (ref 26.0–34.0)
MCHC: 34.2 g/dL (ref 30.0–36.0)
MCV: 92.5 fL (ref 80.0–100.0)
Monocytes Absolute: 0.7 10*3/uL (ref 0.1–1.0)
Monocytes Relative: 7 %
Neutro Abs: 7.9 10*3/uL — ABNORMAL HIGH (ref 1.7–7.7)
Neutrophils Relative %: 80 %
Platelets: 144 10*3/uL — ABNORMAL LOW (ref 150–400)
RBC: 4.55 MIL/uL (ref 4.22–5.81)
RDW: 12 % (ref 11.5–15.5)
WBC: 9.8 10*3/uL (ref 4.0–10.5)
nRBC: 0 % (ref 0.0–0.2)

## 2022-04-09 LAB — URINALYSIS, ROUTINE W REFLEX MICROSCOPIC
Bilirubin Urine: NEGATIVE
Glucose, UA: NEGATIVE mg/dL
Hgb urine dipstick: NEGATIVE
Ketones, ur: 20 mg/dL — AB
Leukocytes,Ua: NEGATIVE
Nitrite: NEGATIVE
Protein, ur: 30 mg/dL — AB
Specific Gravity, Urine: >1.046 — ABNORMAL HIGH (ref 1.005–1.030)
pH: 7 (ref 5.0–8.0)

## 2022-04-09 LAB — COMPREHENSIVE METABOLIC PANEL
ALT: 18 U/L (ref 0–44)
AST: 22 U/L (ref 15–41)
Albumin: 4.3 g/dL (ref 3.5–5.0)
Alkaline Phosphatase: 45 U/L (ref 38–126)
Anion gap: 11 (ref 5–15)
BUN: 23 mg/dL (ref 8–23)
CO2: 26 mmol/L (ref 22–32)
Calcium: 9.9 mg/dL (ref 8.9–10.3)
Chloride: 101 mmol/L (ref 98–111)
Creatinine, Ser: 0.75 mg/dL (ref 0.61–1.24)
GFR, Estimated: >60 mL/min (ref >60)
Glucose, Bld: 148 mg/dL — ABNORMAL HIGH (ref 70–99)
Potassium: 3.8 mmol/L (ref 3.5–5.1)
Sodium: 138 mmol/L (ref 135–145)
Total Bilirubin: 1.5 mg/dL — ABNORMAL HIGH (ref 0.3–1.2)
Total Protein: 7.8 g/dL (ref 6.5–8.1)

## 2022-04-09 LAB — HIV ANTIBODY (ROUTINE TESTING W REFLEX): HIV Screen 4th Generation wRfx: NONREACTIVE

## 2022-04-09 LAB — LIPASE, BLOOD: Lipase: 42 U/L (ref 11–51)

## 2022-04-09 SURGERY — REPAIR, HERNIA, INGUINAL, ADULT
Anesthesia: General | Laterality: Right

## 2022-04-09 MED ORDER — DEXAMETHASONE SODIUM PHOSPHATE 10 MG/ML IJ SOLN
INTRAMUSCULAR | Status: DC | PRN
  Administered 2022-04-09: 4 mg via INTRAVENOUS

## 2022-04-09 MED ORDER — METOPROLOL TARTRATE 5 MG/5ML IV SOLN: 5.0000 mg | Freq: Four times a day (QID) | INTRAVENOUS | Status: DC | PRN

## 2022-04-09 MED ORDER — OXYCODONE HCL 5 MG/5ML PO SOLN: 5.0000 mg | Freq: Once | ORAL | Status: DC | PRN

## 2022-04-09 MED ORDER — EPHEDRINE 5 MG/ML INJ
INTRAVENOUS | Status: AC
Start: 2022-04-09 — End: ?
  Filled 2022-04-09: qty 5

## 2022-04-09 MED ORDER — FENTANYL CITRATE (PF) 100 MCG/2ML IJ SOLN
INTRAMUSCULAR | Status: AC
  Filled 2022-04-09: qty 2

## 2022-04-09 MED ORDER — BUPIVACAINE HCL (PF) 0.5 % IJ SOLN
INTRAMUSCULAR | Status: DC | PRN
  Administered 2022-04-09: 10 mL

## 2022-04-09 MED ORDER — DEXAMETHASONE SODIUM PHOSPHATE 10 MG/ML IJ SOLN
INTRAMUSCULAR | Status: AC
  Filled 2022-04-09: qty 1

## 2022-04-09 MED ORDER — CHLORHEXIDINE GLUCONATE 0.12 % MT SOLN
15.0000 mL | Freq: Once | OROMUCOSAL | Status: AC
  Administered 2022-04-09: 15 mL via OROMUCOSAL

## 2022-04-09 MED ORDER — METHOCARBAMOL 500 MG PO TABS: 500.0000 mg | ORAL_TABLET | Freq: Three times a day (TID) | ORAL | Status: DC | PRN

## 2022-04-09 MED ORDER — ONDANSETRON HCL 4 MG/2ML IJ SOLN: 4.0000 mg | Freq: Four times a day (QID) | INTRAMUSCULAR | Status: DC | PRN

## 2022-04-09 MED ORDER — ROPIVACAINE HCL 5 MG/ML IJ SOLN
INTRAMUSCULAR | Status: DC | PRN
  Administered 2022-04-09: 30 mL via PERINEURAL

## 2022-04-09 MED ORDER — ACETAMINOPHEN 500 MG PO TABS
1000.0000 mg | ORAL_TABLET | Freq: Four times a day (QID) | ORAL | Status: DC
  Administered 2022-04-09 – 2022-04-10 (×4): 1000 mg via ORAL
  Filled 2022-04-09 (×4): qty 2

## 2022-04-09 MED ORDER — LACTATED RINGERS IV SOLN: INTRAVENOUS | Status: DC

## 2022-04-09 MED ORDER — ONDANSETRON HCL 4 MG/2ML IJ SOLN
INTRAMUSCULAR | Status: DC | PRN
  Administered 2022-04-09: 4 mg via INTRAVENOUS

## 2022-04-09 MED ORDER — METHOCARBAMOL 1000 MG/10ML IJ SOLN: 500.0000 mg | Freq: Three times a day (TID) | INTRAVENOUS | Status: DC | PRN

## 2022-04-09 MED ORDER — MIDAZOLAM HCL 2 MG/2ML IJ SOLN
1.0000 mg | INTRAMUSCULAR | Status: DC
  Administered 2022-04-09: 1 mg via INTRAVENOUS
  Filled 2022-04-09: qty 2

## 2022-04-09 MED ORDER — MIDAZOLAM HCL 5 MG/5ML IJ SOLN
INTRAMUSCULAR | Status: DC | PRN
  Administered 2022-04-09: 1 mg via INTRAVENOUS

## 2022-04-09 MED ORDER — EPHEDRINE SULFATE-NACL 50-0.9 MG/10ML-% IV SOSY
PREFILLED_SYRINGE | INTRAVENOUS | Status: DC | PRN
  Administered 2022-04-09: 10 mg via INTRAVENOUS
  Administered 2022-04-09: 5 mg via INTRAVENOUS
  Administered 2022-04-09: 10 mg via INTRAVENOUS

## 2022-04-09 MED ORDER — OXYCODONE HCL 5 MG PO TABS: 5.0000 mg | ORAL_TABLET | Freq: Once | ORAL | Status: DC | PRN

## 2022-04-09 MED ORDER — ONDANSETRON HCL 4 MG/2ML IJ SOLN
INTRAMUSCULAR | Status: AC
  Filled 2022-04-09: qty 2

## 2022-04-09 MED ORDER — SODIUM CHLORIDE (PF) 0.9 % IJ SOLN
INTRAMUSCULAR | Status: AC
  Filled 2022-04-09: qty 50

## 2022-04-09 MED ORDER — MIDAZOLAM HCL 2 MG/2ML IJ SOLN
INTRAMUSCULAR | Status: AC
  Filled 2022-04-09: qty 2

## 2022-04-09 MED ORDER — LIDOCAINE 2% (20 MG/ML) 5 ML SYRINGE
INTRAMUSCULAR | Status: DC | PRN
  Administered 2022-04-09: 60 mg via INTRAVENOUS

## 2022-04-09 MED ORDER — PROPOFOL 10 MG/ML IV BOLUS
INTRAVENOUS | Status: DC | PRN
  Administered 2022-04-09: 150 mg via INTRAVENOUS

## 2022-04-09 MED ORDER — MORPHINE SULFATE (PF) 2 MG/ML IV SOLN: 2.0000 mg | INTRAVENOUS | Status: DC | PRN

## 2022-04-09 MED ORDER — FENTANYL CITRATE (PF) 100 MCG/2ML IJ SOLN
INTRAMUSCULAR | Status: DC | PRN
  Administered 2022-04-09: 25 ug via INTRAVENOUS

## 2022-04-09 MED ORDER — IOHEXOL 300 MG/ML  SOLN
100.0000 mL | Freq: Once | INTRAMUSCULAR | Status: AC | PRN
  Administered 2022-04-09: 100 mL via INTRAVENOUS

## 2022-04-09 MED ORDER — FENTANYL CITRATE PF 50 MCG/ML IJ SOSY
50.0000 ug | PREFILLED_SYRINGE | INTRAMUSCULAR | Status: DC
  Administered 2022-04-09: 50 ug via INTRAVENOUS
  Filled 2022-04-09: qty 2

## 2022-04-09 MED ORDER — MORPHINE SULFATE (PF) 4 MG/ML IV SOLN
4.0000 mg | Freq: Once | INTRAVENOUS | Status: AC
  Administered 2022-04-09: 4 mg via INTRAVENOUS
  Filled 2022-04-09: qty 1

## 2022-04-09 MED ORDER — BUPIVACAINE HCL (PF) 0.5 % IJ SOLN
INTRAMUSCULAR | Status: AC
  Filled 2022-04-09: qty 30

## 2022-04-09 MED ORDER — ONDANSETRON HCL 4 MG/2ML IJ SOLN
4.0000 mg | Freq: Once | INTRAMUSCULAR | Status: AC
  Administered 2022-04-09: 4 mg via INTRAVENOUS
  Filled 2022-04-09: qty 2

## 2022-04-09 MED ORDER — FENTANYL CITRATE PF 50 MCG/ML IJ SOSY: 25.0000 ug | PREFILLED_SYRINGE | INTRAMUSCULAR | Status: DC | PRN

## 2022-04-09 MED ORDER — BUPIVACAINE LIPOSOME 1.3 % IJ SUSP
INTRAMUSCULAR | Status: AC
  Filled 2022-04-09: qty 20

## 2022-04-09 MED ORDER — SODIUM CHLORIDE 0.9 % IV BOLUS
1000.0000 mL | Freq: Once | INTRAVENOUS | Status: AC
  Administered 2022-04-09: 1000 mL via INTRAVENOUS

## 2022-04-09 MED ORDER — OXYCODONE HCL 5 MG PO TABS: 5.0000 mg | ORAL_TABLET | ORAL | Status: DC | PRN

## 2022-04-09 MED ORDER — CEFAZOLIN SODIUM-DEXTROSE 2-4 GM/100ML-% IV SOLN
2.0000 g | Freq: Once | INTRAVENOUS | Status: AC
Start: 2022-04-09 — End: 2022-04-09
  Administered 2022-04-09: 2 g via INTRAVENOUS
  Filled 2022-04-09: qty 100

## 2022-04-09 MED ORDER — POLYETHYLENE GLYCOL 3350 17 G PO PACK
17.0000 g | PACK | Freq: Every day | ORAL | Status: DC | PRN
Start: 2022-04-09 — End: 2022-04-10

## 2022-04-09 MED ORDER — ORAL CARE MOUTH RINSE: 15.0000 mL | Freq: Once | OROMUCOSAL | Status: AC

## 2022-04-09 MED ORDER — ONDANSETRON 4 MG PO TBDP: 4.0000 mg | ORAL_TABLET | Freq: Four times a day (QID) | ORAL | Status: DC | PRN

## 2022-04-09 MED ORDER — 0.9 % SODIUM CHLORIDE (POUR BTL) OPTIME
TOPICAL | Status: DC | PRN
  Administered 2022-04-09: 1000 mL

## 2022-04-09 SURGICAL SUPPLY — 36 items
ADH SKN CLS APL DERMABOND .7 (GAUZE/BANDAGES/DRESSINGS) ×1
APL PRP STRL LF DISP 70% ISPRP (MISCELLANEOUS) ×1
BAG COUNTER SPONGE SURGICOUNT (BAG) IMPLANT
BAG SPNG CNTER NS LX DISP (BAG)
BLADE SURG 15 STRL LF DISP TIS (BLADE) ×1 IMPLANT
BLADE SURG 15 STRL SS (BLADE) ×2
CHLORAPREP W/TINT 26 (MISCELLANEOUS) ×2 IMPLANT
DERMABOND ADVANCED (GAUZE/BANDAGES/DRESSINGS) ×1
DERMABOND ADVANCED .7 DNX12 (GAUZE/BANDAGES/DRESSINGS) ×1 IMPLANT
DRAIN PENROSE 0.5X18 (DRAIN) ×2 IMPLANT
DRAPE LAPAROTOMY TRNSV 102X78 (DRAPES) ×2 IMPLANT
ELECT REM PT RETURN 15FT ADLT (MISCELLANEOUS) ×2 IMPLANT
GAUZE SPONGE 4X4 12PLY STRL (GAUZE/BANDAGES/DRESSINGS) IMPLANT
GLOVE BIO SURGEON STRL SZ7.5 (GLOVE) ×2 IMPLANT
GOWN STRL REUS W/ TWL XL LVL3 (GOWN DISPOSABLE) ×2 IMPLANT
GOWN STRL REUS W/TWL XL LVL3 (GOWN DISPOSABLE) ×4
KIT BASIN OR (CUSTOM PROCEDURE TRAY) ×2 IMPLANT
KIT TURNOVER KIT A (KITS) ×1 IMPLANT
MARKER SKIN DUAL TIP RULER LAB (MISCELLANEOUS) ×2 IMPLANT
MESH PARIETEX PROGRIP RIGHT (Mesh General) ×1 IMPLANT
NDL HYPO 25X1 1.5 SAFETY (NEEDLE) ×1 IMPLANT
NEEDLE HYPO 25X1 1.5 SAFETY (NEEDLE) ×2 IMPLANT
PACK GENERAL/GYN (CUSTOM PROCEDURE TRAY) ×2 IMPLANT
SPIKE FLUID TRANSFER (MISCELLANEOUS) IMPLANT
SPONGE T-LAP 4X18 ~~LOC~~+RFID (SPONGE) ×2 IMPLANT
SUT MNCRL AB 4-0 PS2 18 (SUTURE) ×2 IMPLANT
SUT SILK 2 0 SH (SUTURE) ×1 IMPLANT
SUT VIC AB 2-0 CT1 27 (SUTURE)
SUT VIC AB 2-0 CT1 TAPERPNT 27 (SUTURE) IMPLANT
SUT VIC AB 3-0 54XBRD REEL (SUTURE) IMPLANT
SUT VIC AB 3-0 BRD 54 (SUTURE)
SUT VIC AB 3-0 SH 27 (SUTURE)
SUT VIC AB 3-0 SH 27XBRD (SUTURE) IMPLANT
SYR CONTROL 10ML LL (SYRINGE) ×2 IMPLANT
TOWEL OR 17X26 10 PK STRL BLUE (TOWEL DISPOSABLE) ×2 IMPLANT
TOWEL OR NON WOVEN STRL DISP B (DISPOSABLE) ×2 IMPLANT

## 2022-04-09 NOTE — Anesthesia Preprocedure Evaluation (Signed)
Anesthesia Evaluation  Patient identified by MRN, date of birth, ID band Patient awake    Reviewed: Allergy & Precautions, H&P , NPO status , Patient's Chart, lab work & pertinent test results  History of Anesthesia Complications (+) PONV and history of anesthetic complications  Airway Mallampati: II   Neck ROM: full    Dental   Pulmonary asthma , former smoker,    breath sounds clear to auscultation       Cardiovascular hypertension, + Peripheral Vascular Disease   Rhythm:regular Rate:Normal     Neuro/Psych  Headaches, PSYCHIATRIC DISORDERS Anxiety Depression    GI/Hepatic GERD  ,  Endo/Other    Renal/GU      Musculoskeletal Bone tumors   Abdominal   Peds  Hematology   Anesthesia Other Findings   Reproductive/Obstetrics                             Anesthesia Physical Anesthesia Plan  ASA: 3  Anesthesia Plan: General   Post-op Pain Management: Regional block*   Induction: Intravenous  PONV Risk Score and Plan: 3 and Ondansetron, Dexamethasone, Midazolam and Treatment may vary due to age or medical condition  Airway Management Planned: LMA  Additional Equipment:   Intra-op Plan:   Post-operative Plan: Extubation in OR  Informed Consent: I have reviewed the patients History and Physical, chart, labs and discussed the procedure including the risks, benefits and alternatives for the proposed anesthesia with the patient or authorized representative who has indicated his/her understanding and acceptance.     Dental advisory given  Plan Discussed with: CRNA, Anesthesiologist and Surgeon  Anesthesia Plan Comments:         Anesthesia Quick Evaluation

## 2022-04-09 NOTE — Plan of Care (Signed)
  Problem: Clinical Measurements: Goal: Ability to maintain clinical measurements within normal limits will improve Outcome: Progressing   Problem: Activity: Goal: Risk for activity intolerance will decrease Outcome: Progressing   Problem: Nutrition: Goal: Adequate nutrition will be maintained Outcome: Progressing   Problem: Elimination: Goal: Will not experience complications related to urinary retention Outcome: Progressing   Problem: Activity: Goal: Risk for activity intolerance will decrease Outcome: Progressing

## 2022-04-09 NOTE — ED Notes (Signed)
Pt had 1 episode of diarrhea, pt cleaned, pericare provided and bed change provided. Pt tolerated well.

## 2022-04-09 NOTE — Anesthesia Procedure Notes (Signed)
Anesthesia Regional Block: TAP block   Pre-Anesthetic Checklist: , timeout performed,  Correct Patient, Correct Site, Correct Laterality,  Correct Procedure, Correct Position, site marked,  Risks and benefits discussed,  Surgical consent,  Pre-op evaluation,  At surgeon's request and post-op pain management  Laterality: Right  Prep: chloraprep       Needles:  Injection technique: Single-shot  Needle Type: Echogenic Needle     Needle Length: 9cm  Needle Gauge: 21     Additional Needles:   Narrative:  Start time: 04/09/2022 12:20 PM End time: 04/09/2022 12:30 PM Injection made incrementally with aspirations every 5 mL.  Performed by: Personally  Anesthesiologist: Albertha Ghee, MD  Additional Notes: Pt tolerated the procedure well.

## 2022-04-09 NOTE — ED Notes (Signed)
General surgery at bedside. 

## 2022-04-09 NOTE — ED Notes (Signed)
Patient transported to CT 

## 2022-04-09 NOTE — Anesthesia Procedure Notes (Signed)
Procedure Name: LMA Insertion Date/Time: 04/09/2022 1:06 PM  Performed by: Lavina Hamman, CRNAPre-anesthesia Checklist: Patient identified, Emergency Drugs available, Suction available, Patient being monitored and Timeout performed Patient Re-evaluated:Patient Re-evaluated prior to induction Oxygen Delivery Method: Circle system utilized Preoxygenation: Pre-oxygenation with 100% oxygen Induction Type: IV induction Ventilation: Mask ventilation without difficulty LMA: LMA inserted LMA Size: 5.0 Number of attempts: 1 Placement Confirmation: positive ETCO2 and breath sounds checked- equal and bilateral Tube secured with: Tape Dental Injury: Teeth and Oropharynx as per pre-operative assessment

## 2022-04-09 NOTE — ED Notes (Addendum)
Pt endorses intermittent incontinence. Male Purwick placed by Nts.

## 2022-04-09 NOTE — ED Notes (Addendum)
Pt returns from CT. Verbalized improvement of pain from 8/10 to 2/10. Pt also states resolution of nausea.

## 2022-04-09 NOTE — Progress Notes (Signed)
Pacu RN Report to floor given  Gave report to  [employer]. Room: 1303   Discussed surgery, meds given in OR and Pacu, VS, IV fluids given, EBL, urine output, pain and other pertinent information. Also discussed if pt had any family or friends here or belongings with them. Dermabond to R groin incision. No pain, block working well. VSS.   Pt exits my care.

## 2022-04-09 NOTE — Transfer of Care (Signed)
Immediate Anesthesia Transfer of Care Note  Patient: Tommy [name]  Procedure(s) Performed: Procedure(s) with comments: HERNIA REPAIR INGUINAL ADULT (Right) - with mesh   Patient Location: PACU  Anesthesia Type:General  Level of Consciousness:  sedated, patient cooperative and responds to stimulation  Airway & Oxygen Therapy:Patient Spontanous Breathing and Patient connected to face mask oxgen  Post-op Assessment:  Report given to PACU RN and Post -op Vital signs reviewed and stable  Post vital signs:  Reviewed and stable  Last Vitals:  Vitals:   04/09/22 1230 04/09/22 1235  BP: 111/67 114/66  Pulse: (!) 52 (!) 52  Resp: 11 11  Temp:    SpO2: 563% 893%    Complications: No apparent anesthesia complications

## 2022-04-09 NOTE — Progress Notes (Signed)
Attempted to get report placed on hold by ED > 10 minutes.  Will try again.

## 2022-04-09 NOTE — ED Provider Notes (Signed)
[facility] DEPT Provider Note   CSN: [id number] Arrival date & time: 04/09/22  3818     History  Chief Complaint  Patient presents with   Abdominal Pain   Nausea    Tommy [name] is a 71 y.o. male.  Patient is a 71 year old male with past medical history of atrial fibrillation on Eliquis, hypertension, and prior prostate surgery.  Patient presenting today with complaints of generalized abdominal pain.  This has been worsening throughout the day.  He has felt nauseated, but has not vomited.  He denies any fevers or chills.  He denies ill contacts.  He does report having a bowel movement approximately 8 PM this evening, but has not had a bowel movement or passed gas since.  The history is provided by the patient.  Abdominal Pain Pain location:  Generalized Pain quality: cramping   Pain radiates to:  Does not radiate Pain severity:  Severe Onset quality:  Sudden Duration:  1 day Timing:  Constant Progression:  Worsening Chronicity:  New      Home Medications Prior to Admission medications   Medication Sig Start Date End Date Taking? Authorizing Provider  acetaminophen (TYLENOL) 325 MG tablet Take 2 tablets (650 mg total) by mouth every 4 (four) hours as needed for headache or mild pain. 04/03/15   Erlene Quan, PA-C  apixaban (ELIQUIS) 5 MG TABS tablet Take 1 tablet (5 mg total) by mouth 2 (two) times daily. 12/17/21   Leonie Man, MD  clorazepate (TRANXENE) 3.75 MG tablet Take 3.75 mg by mouth. Once to twice weekly as needed    [provider]  latanoprost (XALATAN) 0.005 % ophthalmic solution Place 1 drop into both eyes at bedtime. Alternates 1 drop in each eye daily    [provider]  metoprolol tartrate (LOPRESSOR) 50 MG tablet TAKE 2 & 1/2 (TWO & ONE-HALF) TABLETS BY MOUTH IN THE MORNING, NOON AT BEDTIME KEEP UPCOMING APPOINTMENT 07/28/21   Leonie Man, MD  nystatin ointment (MYCOSTATIN) Apply 1 application  topically every morning. 01/24/15   [provider]      Allergies    Ibuprofen, Paxil [paroxetine hcl], and Sudafed [pseudoephedrine]    Review of Systems   Review of Systems  Gastrointestinal:  Positive for abdominal pain.  All other systems reviewed and are negative.   Physical Exam Updated Vital Signs BP 136/79   Pulse (!) 50   Temp (!) 97.5 F (36.4 C) (Oral)   Resp 13   Ht '6\' 8"'$  (2.032 m)   Wt 90.7 kg   SpO2 100%   BMI 21.97 kg/m  Physical Exam Vitals and nursing note reviewed.  Constitutional:      General: He is not in acute distress.    Appearance: He is well-developed. He is not diaphoretic.  HENT:     Head: Normocephalic and atraumatic.  Cardiovascular:     Rate and Rhythm: Normal rate and regular rhythm.     Heart sounds: No murmur heard.    No friction rub.  Pulmonary:     Effort: Pulmonary effort is normal. No respiratory distress.     Breath sounds: Normal breath sounds. No wheezing or rales.  Abdominal:     General: Bowel sounds are normal. There is no distension.     Palpations: Abdomen is soft.     Tenderness: There is generalized abdominal tenderness. There is no right CVA tenderness, left CVA tenderness, guarding or rebound.  Musculoskeletal:  General: Normal range of motion.     Cervical back: Normal range of motion and neck supple.  Skin:    General: Skin is warm and dry.  Neurological:     Mental Status: He is alert and oriented to person, place, and time.     Coordination: Coordination normal.     ED Results / Procedures / Treatments   Labs (all labs ordered are listed, but only abnormal results are displayed) Labs Reviewed  COMPREHENSIVE METABOLIC PANEL - Abnormal; Notable for the following components:      Result Value   Glucose, Bld 148 (*)    Total Bilirubin 1.5 (*)    All other components within normal limits  CBC WITH DIFFERENTIAL/PLATELET - Abnormal; Notable for the following components:   Platelets 144 (*)     Neutro Abs 7.9 (*)    All other components within normal limits  LIPASE, BLOOD  URINALYSIS, ROUTINE W REFLEX MICROSCOPIC    EKG None  Radiology DG Abdomen 1 View  Result Date: 04/09/2022 CLINICAL DATA:  Abdominal pain EXAM: ABDOMEN - 1 VIEW COMPARISON:  11/30/2012 abdominal CT FINDINGS: Gas dilated loops of small bowel in the central abdomen measuring up to 5 cm in diameter. Relative paucity of colonic gas and stool. No concerning mass effect or evidence of pneumoperitoneum. Clear lung bases. IMPRESSION: Small bowel obstruction pattern. Electronically Signed   By: Jorje Guild M.D.   On: 04/09/2022 04:03    Procedures Procedures    Medications Ordered in ED Medications  sodium chloride 0.9 % bolus 1,000 mL (has no administration in time range)  ondansetron (ZOFRAN) injection 4 mg (has no administration in time range)  morphine (PF) 4 MG/ML injection 4 mg (has no administration in time range)    ED Course/ Medical Decision Making/ A&P Clinical Course as of 04/10/22 0203  Thu Apr 09, 2022  0708 Incarcerated hernia with bowel obstruction, pending surgery eval [MK]    Clinical Course User Index [MK] Kommor, [place], MD   This patient presents to the ED for concern of abdominal pain, this involves an extensive number of treatment options, and is a complaint that carries with it a high risk of complications and morbidity.  The differential diagnosis includes small bowel obstruction, acute pancreatitis, acute cholecystitis, appendicitis   Co morbidities that complicate the patient evaluation  None   Additional history obtained:  No additional history or external records needed   Lab Tests:  I Ordered, and personally interpreted labs.  The pertinent results include: Unremarkable CBC and chemistries.   Imaging Studies ordered:  I ordered imaging studies including CT scan of the abdomen and pelvis I independently visualized and interpreted imaging which showed small  bowel obstruction caused by an incarcerated inguinal hernia I agree with the radiologist interpretation   Cardiac Monitoring: / EKG:  The patient was maintained on a cardiac monitor.  I personally viewed and interpreted the cardiac monitored which showed an underlying rhythm of: Sinus   Consultations Obtained:  I requested consultation with the general surgeon, Dr. Johney Maine,  and discussed lab and imaging findings as well as pertinent plan - they recommend: Patient to be evaluated by general surgery   Problem List / ED Course / Critical interventions / Medication management  Patient presenting today with complaints of generalized abdominal pain and cramping.  He has history of prior hernia repair and has a recurrent hernia in the right inguinal region which is causing obstruction.  I attempted on 2 occasions to reduce this,  but was unsuccessful.  I have spoken with Dr. Johney Maine from general surgery and general surgery will evaluate. I ordered medication including morphine for pain and Zofran for nausea Reevaluation of the patient after these medicines showed that the patient improved I have reviewed the patients home medicines and have made adjustments as needed   Social Determinants of Health:  None   Test / Admission - Considered:  Patient to be evaluated by general surgery with disposition pending  Final Clinical Impression(s) / ED Diagnoses Final diagnoses:  None    Rx / DC Orders ED Discharge Orders     None         Veryl Speak, MD 04/10/22 0206

## 2022-04-09 NOTE — H&P (Signed)
CC: abdominal pain, nausea  HPI: This is a 71 year old gentleman who presents with generalized abdominal pain and nausea.  He reports some intermittent right inguinal pain but did not notice the bulge.  He presented to the emergency department where a CT scan was performed showing an incarcerated right inguinal hernia with a small bowel obstruction.  He has had no emesis.  He has not had a bowel movement since yesterday and has not passed any flatus.  He denies fevers or chills.  He is on Eliquis for A-fib.  He denies chest pain or shortness of breath.  Past Medical History:  Diagnosis Date   Anxiety    Asthma    ASTHMA AS A CHILD - NO PROBLEMS NOW   Atrial flutter with controlled response ([facility]) 04/02/2015   Bone tumor    BONE TUMORS LEFT UPPER ARM, ABOVE LEFT KNEE AND MAYBE IN JAWS --TOLD BENIGN    Cancer (HCC)    PROSTATE CANCER   Depression    GERD (gastroesophageal reflux disease)    NOT A RECENT PROBLEM - BUT DID TAKE OMEPRAZOLE COUPLE OF MONTHS AGO    Headache(784.0)    MIGRAINES   Heart murmur    TOLD HE HAS A SLIGHT HEART MURMUR   Hypertension    Pain    HX OF LOWER BACK PAIN AND RUPTURED DISK 2001 -BUT NO SURGERY   Palpitations    Paroxysmal atrial fibrillation (HCC)    PONV (postoperative nausea and vomiting)     Past Surgical History:  Procedure Laterality Date   BENIGN SKIN TUMOR EXCISED LEFT ARMPIT  ? 1972     BONE TUMOR EXCISION  1973   LEFT LEG   HERNIA REPAIR     RIGHT ING 1968 AND LEFT ING 1994 WITH MESH   ROBOT ASSISTED LAPAROSCOPIC RADICAL PROSTATECTOMY N/A 04/13/2013   Procedure: ROBOTIC ASSISTED LAPAROSCOPIC RADICAL PROSTATECTOMY LEVEL 3;  Surgeon: Dutch Gray, MD;  Location: [facility];  Service: Urology;  Laterality: N/A;   TRANSTHORACIC ECHOCARDIOGRAM  04/03/2015   EF 55 and 60%. No regional WMA. Mild MR. Mildly dilated ascending aorta (4.5 mm in the aortic root). Mild RA dilation.    Family History  Problem Relation Age of Onset   Heart disease  Mother        CABG   Heart failure Mother    Cancer Father    Cirrhosis Father    Healthy Sister    Healthy Brother     Social:  reports that he quit smoking about 51 years ago. His smoking use included cigarettes, pipe, and cigars. He has never used smokeless tobacco. He reports current alcohol use of about 1.0 standard drink of alcohol per week. He reports that he does not use drugs.  Allergies:  Allergies  Allergen Reactions   Ibuprofen     Sleepwalking   Paxil [Paroxetine Hcl] Other (See Comments)    Droopy face/disoriented.   Sudafed [Pseudoephedrine]     Causes a "rebound headache"     Medications: I have reviewed the patient's current medications.  Results for orders placed or performed during the hospital encounter of 04/09/22 (from the past 48 hour(s))  Comprehensive metabolic panel     Status: Abnormal   Collection Time: 04/09/22  3:26 AM  Result Value Ref Range   Sodium 138 135 - 145 mmol/L   Potassium 3.8 3.5 - 5.1 mmol/L   Chloride 101 98 - 111 mmol/L   CO2 26 22 - 32 mmol/L   Glucose,  Bld 148 (H) 70 - 99 mg/dL    Comment: Glucose reference range applies only to samples taken after fasting for at least 8 hours.   BUN 23 8 - 23 mg/dL   Creatinine, Ser 0.75 0.61 - 1.24 mg/dL   Calcium 9.9 8.9 - 10.3 mg/dL   Total Protein 7.8 6.5 - 8.1 g/dL   Albumin 4.3 3.5 - 5.0 g/dL   AST 22 15 - 41 U/L   ALT 18 0 - 44 U/L   Alkaline Phosphatase 45 38 - 126 U/L   Total Bilirubin 1.5 (H) 0.3 - 1.2 mg/dL   GFR, Estimated >60 >60 mL/min    Comment: (NOTE) Calculated using the CKD-EPI Creatinine Equation (2021)    Anion gap 11 5 - 15    Comment: Performed at [facility], [place] [address], [place], [place] [address]  Lipase, blood     Status: None   Collection Time: 04/09/22  3:26 AM  Result Value Ref Range   Lipase 42 11 - 51 U/L    Comment: Performed at [facility], [place] [address] [place], [place] [address]  CBC with Diff      Status: Abnormal   Collection Time: 04/09/22  3:26 AM  Result Value Ref Range   WBC 9.8 4.0 - 10.5 K/uL   RBC 4.55 4.22 - 5.81 MIL/uL   Hemoglobin 14.4 13.0 - 17.0 g/dL   HCT 42.1 39.0 - 52.0 %   MCV 92.5 80.0 - 100.0 fL   MCH 31.6 26.0 - 34.0 pg   MCHC 34.2 30.0 - 36.0 g/dL   RDW 12.0 11.5 - 15.5 %   Platelets 144 (L) 150 - 400 K/uL   nRBC 0.0 0.0 - 0.2 %   Neutrophils Relative % 80 %   Neutro Abs 7.9 (H) 1.7 - 7.7 K/uL   Lymphocytes Relative 12 %   Lymphs Abs 1.2 0.7 - 4.0 K/uL   Monocytes Relative 7 %   Monocytes Absolute 0.7 0.1 - 1.0 K/uL   Eosinophils Relative 1 %   Eosinophils Absolute 0.1 0.0 - 0.5 K/uL   Basophils Relative 0 %   Basophils Absolute 0.0 0.0 - 0.1 K/uL   Immature Granulocytes 0 %   Abs Immature Granulocytes 0.03 0.00 - 0.07 K/uL    Comment: Performed at [facility], [place] [address] [place], [place] [address]    CT ABDOMEN PELVIS W CONTRAST  Result Date: 04/09/2022 CLINICAL DATA:  Abdominal pain and nausea. EXAM: CT ABDOMEN AND PELVIS WITH CONTRAST TECHNIQUE: Multidetector CT imaging of the abdomen and pelvis was performed using the standard protocol following bolus administration of intravenous contrast. RADIATION DOSE REDUCTION: This exam was performed according to the departmental dose-optimization program which includes automated exposure control, adjustment of the mA and/or kV according to patient size and/or use of iterative reconstruction technique. CONTRAST:  140m OMNIPAQUE IOHEXOL 300 MG/ML  SOLN COMPARISON:  11/30/2012 FINDINGS: Lower chest: Unremarkable. Hepatobiliary: No suspicious focal abnormality within the liver parenchyma. There is no evidence for gallstones, gallbladder wall thickening, or pericholecystic fluid. No intrahepatic or extrahepatic biliary dilation. Pancreas: Mild diffuse prominence of the main pancreatic duct. No focal mass lesion or peripancreatic edema/inflammation. Spleen: No splenomegaly. No focal mass lesion.  Adrenals/Urinary Tract: No adrenal nodule or mass. Kidneys unremarkable. No evidence for hydroureter. The urinary bladder appears normal for the degree of distention. Stomach/Bowel: Stomach is fluid-filled and moderately distended. Duodenum is normally positioned as is the ligament of Treitz. Dilated fluid-filled  small bowel loops in the abdomen and pelvis measure up to 4 cm diameter. Small bowel is dilated down to the level of a right groin hernia where a short segment of small bowel protrudes through the fascial defect. Small-bowel entering from the defect is difficult to delineate but the distal and terminal ileum is completely decompressed. The appendix is not well visualized, but there is no edema or inflammation in the region of the cecum. Colon is diffusely decompressed appearance Vascular/Lymphatic: There is mild atherosclerotic calcification of the abdominal aorta without aneurysm. There is no gastrohepatic or hepatoduodenal ligament lymphadenopathy. No retroperitoneal or mesenteric lymphadenopathy. No pelvic sidewall lymphadenopathy. Reproductive: Prostatectomy. Other: Trace free fluid identified in the posterior mesentery (image 57/2). Interloop mesenteric fluid noted right abdomen and pelvis. Musculoskeletal: No worrisome lytic or sclerotic osseous abnormality. IMPRESSION: 1. Small bowel obstruction secondary to an incarcerated right groin hernia where a short segment of small bowel protrudes through the fascial defect. Distal and terminal ileum and the colon are completely decompressed. No evidence for pneumatosis. 2. Trace free fluid in the abdomen and pelvis. 3. Aortic Atherosclerosis (ICD10-I70.0). Electronically Signed   By: Misty Stanley M.D.   On: 04/09/2022 05:38   DG Abdomen 1 View  Result Date: 04/09/2022 CLINICAL DATA:  Abdominal pain EXAM: ABDOMEN - 1 VIEW COMPARISON:  11/30/2012 abdominal CT FINDINGS: Gas dilated loops of small bowel in the central abdomen measuring up to 5 cm in  diameter. Relative paucity of colonic gas and stool. No concerning mass effect or evidence of pneumoperitoneum. Clear lung bases. IMPRESSION: Small bowel obstruction pattern. Electronically Signed   By: Jorje Guild M.D.   On: 04/09/2022 04:03    ROS - all of the below systems have been reviewed with the patient and positives are indicated with bold text General: chills, fever or night sweats Eyes: blurry vision or double vision ENT: epistaxis or sore throat Allergy/Immunology: itchy/watery eyes or nasal congestion Hematologic/Lymphatic: bleeding problems, blood clots or swollen lymph nodes Endocrine: temperature intolerance or unexpected weight changes Breast: new or changing breast lumps or nipple discharge Resp: cough, shortness of breath, or wheezing CV: chest pain or dyspnea on exertion GI: as per HPI GU: dysuria, trouble voiding, or hematuria MSK: joint pain or joint stiffness Neuro: TIA or stroke symptoms Derm: pruritus and skin lesion changes Psych: anxiety and depression  PE Blood pressure 106/72, pulse (!) 54, temperature (!) 97.5 F (36.4 C), temperature source Oral, resp. rate 15, height '6\' 8"'$  (2.032 m), weight 90.7 kg, SpO2 100 %. Constitutional: NAD; conversant; no deformities Eyes: Moist conjunctiva; no lid lag; anicteric; PERRL Neck: Trachea midline; no thyromegaly Lungs: Normal respiratory effort; no tactile fremitus CV: RRR; no palpable thrills; no pitting edema GI: Abd distended but nontender, incarcerated right inguinal hernia which is minimally tender; no palpable hepatosplenomegaly MSK: Normal range of motion of extremities; no clubbing/cyanosis Psychiatric: Appropriate affect; alert and oriented x3 Lymphatic: No palpable cervical or axillary lymphadenopathy  Results for orders placed or performed during the hospital encounter of 04/09/22 (from the past 48 hour(s))  Comprehensive metabolic panel     Status: Abnormal   Collection Time: 04/09/22  3:26 AM   Result Value Ref Range   Sodium 138 135 - 145 mmol/L   Potassium 3.8 3.5 - 5.1 mmol/L   Chloride 101 98 - 111 mmol/L   CO2 26 22 - 32 mmol/L   Glucose, Bld 148 (H) 70 - 99 mg/dL    Comment: Glucose reference range applies only to samples taken  after fasting for at least 8 hours.   BUN 23 8 - 23 mg/dL   Creatinine, Ser 0.75 0.61 - 1.24 mg/dL   Calcium 9.9 8.9 - 10.3 mg/dL   Total Protein 7.8 6.5 - 8.1 g/dL   Albumin 4.3 3.5 - 5.0 g/dL   AST 22 15 - 41 U/L   ALT 18 0 - 44 U/L   Alkaline Phosphatase 45 38 - 126 U/L   Total Bilirubin 1.5 (H) 0.3 - 1.2 mg/dL   GFR, Estimated >60 >60 mL/min    Comment: (NOTE) Calculated using the CKD-EPI Creatinine Equation (2021)    Anion gap 11 5 - 15    Comment: Performed at [facility], [place] [address] [place], [place] [address]  Lipase, blood     Status: None   Collection Time: 04/09/22  3:26 AM  Result Value Ref Range   Lipase 42 11 - 51 U/L    Comment: Performed at [facility], [place] [address], [place],  [address]  CBC with Diff     Status: Abnormal   Collection Time: 04/09/22  3:26 AM  Result Value Ref Range   WBC 9.8 4.0 - 10.5 K/uL   RBC 4.55 4.22 - 5.81 MIL/uL   Hemoglobin 14.4 13.0 - 17.0 g/dL   HCT 42.1 39.0 - 52.0 %   MCV 92.5 80.0 - 100.0 fL   MCH 31.6 26.0 - 34.0 pg   MCHC 34.2 30.0 - 36.0 g/dL   RDW 12.0 11.5 - 15.5 %   Platelets 144 (L) 150 - 400 K/uL   nRBC 0.0 0.0 - 0.2 %   Neutrophils Relative % 80 %   Neutro Abs 7.9 (H) 1.7 - 7.7 K/uL   Lymphocytes Relative 12 %   Lymphs Abs 1.2 0.7 - 4.0 K/uL   Monocytes Relative 7 %   Monocytes Absolute 0.7 0.1 - 1.0 K/uL   Eosinophils Relative 1 %   Eosinophils Absolute 0.1 0.0 - 0.5 K/uL   Basophils Relative 0 %   Basophils Absolute 0.0 0.0 - 0.1 K/uL   Immature Granulocytes 0 %   Abs Immature Granulocytes 0.03 0.00 - 0.07 K/uL    Comment: Performed at [facility], [place] [address], [place],  [address]     CT ABDOMEN PELVIS W CONTRAST  Result Date: 04/09/2022 CLINICAL DATA:  Abdominal pain and nausea. EXAM: CT ABDOMEN AND PELVIS WITH CONTRAST TECHNIQUE: Multidetector CT imaging of the abdomen and pelvis was performed using the standard protocol following bolus administration of intravenous contrast. RADIATION DOSE REDUCTION: This exam was performed according to the departmental dose-optimization program which includes automated exposure control, adjustment of the mA and/or kV according to patient size and/or use of iterative reconstruction technique. CONTRAST:  132m OMNIPAQUE IOHEXOL 300 MG/ML  SOLN COMPARISON:  11/30/2012 FINDINGS: Lower chest: Unremarkable. Hepatobiliary: No suspicious focal abnormality within the liver parenchyma. There is no evidence for gallstones, gallbladder wall thickening, or pericholecystic fluid. No intrahepatic or extrahepatic biliary dilation. Pancreas: Mild diffuse prominence of the main pancreatic duct. No focal mass lesion or peripancreatic edema/inflammation. Spleen: No splenomegaly. No focal mass lesion. Adrenals/Urinary Tract: No adrenal nodule or mass. Kidneys unremarkable. No evidence for hydroureter. The urinary bladder appears normal for the degree of distention. Stomach/Bowel: Stomach is fluid-filled and moderately distended. Duodenum is normally positioned as is the ligament of Treitz. Dilated fluid-filled small bowel loops in the abdomen and pelvis measure up to 4 cm diameter. Small bowel is dilated down to  the level of a right groin hernia where a short segment of small bowel protrudes through the fascial defect. Small-bowel entering from the defect is difficult to delineate but the distal and terminal ileum is completely decompressed. The appendix is not well visualized, but there is no edema or inflammation in the region of the cecum. Colon is diffusely decompressed appearance Vascular/Lymphatic: There is mild atherosclerotic calcification of the abdominal aorta  without aneurysm. There is no gastrohepatic or hepatoduodenal ligament lymphadenopathy. No retroperitoneal or mesenteric lymphadenopathy. No pelvic sidewall lymphadenopathy. Reproductive: Prostatectomy. Other: Trace free fluid identified in the posterior mesentery (image 57/2). Interloop mesenteric fluid noted right abdomen and pelvis. Musculoskeletal: No worrisome lytic or sclerotic osseous abnormality. IMPRESSION: 1. Small bowel obstruction secondary to an incarcerated right groin hernia where a short segment of small bowel protrudes through the fascial defect. Distal and terminal ileum and the colon are completely decompressed. No evidence for pneumatosis. 2. Trace free fluid in the abdomen and pelvis. 3. Aortic Atherosclerosis (ICD10-I70.0). Electronically Signed   By: Misty Stanley M.D.   On: 04/09/2022 05:38   DG Abdomen 1 View  Result Date: 04/09/2022 CLINICAL DATA:  Abdominal pain EXAM: ABDOMEN - 1 VIEW COMPARISON:  11/30/2012 abdominal CT FINDINGS: Gas dilated loops of small bowel in the central abdomen measuring up to 5 cm in diameter. Relative paucity of colonic gas and stool. No concerning mass effect or evidence of pneumoperitoneum. Clear lung bases. IMPRESSION: Small bowel obstruction pattern. Electronically Signed   By: Jorje Guild M.D.   On: 04/09/2022 04:03    I have personally reviewed the relevant CT scan  A/P: Incarcerated right inguinal hernia  I discussed the diagnosis with the patient.  As the hernia is creating a bowel obstruction, urgent repair of the right inguinal hernia with possible mesh is recommended.  We discussed abdominal wall anatomy and hernia repair.  He has had a previous open right inguinal hernia without mesh many years ago and has had an open left inguinal hernia repair with mesh.  I explained surgical procedure in detail.  We explained the risks which includes but is not limited to bleeding, infection, injury to surrounding structures, the need to perform a  bowel resection if the small bowel is compromised, use of mesh, hernia recurrence, chronic pain, nerve entrapment, cardiopulmonary issues, postoperative recovery, etc.  He understands and agrees to proceed with surgery which is scheduled urgently  Complex medical decision making   Coralie Keens, MD [facility], [place]

## 2022-04-09 NOTE — Op Note (Signed)
   Tommy [name] 04/09/2022   Pre-op Diagnosis: INCARCERATED, RECURRENT RIGHT INGUINAL HERNIA     Post-op Diagnosis: same  Procedure(s): OPEN REPAIR INCARCERATED RIGHT INGUINAL HERNIA WITH MESH  Surgeon(s): Coralie Keens, MD  Anesthesia: General  Staff:  Circulator: Reeves Dam, RN Physician Assistant: Norm Parcel, PA-C Scrub Person: Jeanne Ivan  Estimated Blood Loss: Minimal               Indications: This is a 71 year old gentleman who presents with an incarcerated right inguinal hernia after presenting to the emergency room with abdominal pain.  He has had a previous right inguinal hernia repair without mesh many years ago.  A CT scan confirmed an incarcerated hernia containing small bowel creating a small bowel obstruction  Findings: At the time of induction of general anesthesia the hernia had reduced back into the abdominal cavity.  There was an old direct hernia sac.  There was no evidence of compromised bowel  Procedure: The patient was brought to operating identifies correct patient.  He was placed upon the operating table and general anesthesia was induced.  He had already received a tap block preoperatively on the right side by this etiologies.  His abdomen was then prepped and draped in usual sterile fashion.  I anesthetized the skin at his previous right inguinal scar with Marcaine.  I then made incision through this with electrocautery.  I dissected down through Scarpa's fascia with the cautery.  This was mildly scarred from his previous surgery.  I then opened up the external bleak fascia toward the internal and external rings.  Identify the testicular cord and structures were controlled with a Penrose drain.  There was no evidence of indirect hernia.  He had a large direct hernia sac.  It was very thickened.  I was able to open up the sac and all the bowel had already been reduced back to the abdominal cavity.  The base of the sac  was quite small.  I closed it with a 2-0 silk suture and then excised the redundant sac with the cautery.  I then reapproximated the floor of the inguinal canal with several interrupted 2-0 silk sutures.  I then brought a piece of Prolene ProGrip right-sided mesh onto the field by activity and.  I placed against the pubic tubercle and brought around core structures.  I then sutured in place with 2 separate 2-0 Vicryl sutures.  Wide coverage of the inguinal floor and internal ring appeared to be achieved.  We then closed the external oblique fascia over the mesh with a running 2-0 Vicryl suture.  Scarpa's fascia was closed with interrupted 3-0 Vicryl sutures and the skin was closed with running 4-0 Monocryl.  Dermabond was then applied.  The patient tolerated the procedure well.  All the counts were correct at the end of the procedure.  The patient was then extubated in the operating room and taken in stable condition to the recovery room.          Coralie Keens   Date: 04/09/2022  Time: 1:24 PM

## 2022-04-09 NOTE — Discharge Instructions (Signed)
[facility] _______[facility]  UMBILICAL OR INGUINAL HERNIA REPAIR: POST OP INSTRUCTIONS  Always review your discharge instruction sheet given to you by the facility where your surgery was performed. IF YOU HAVE DISABILITY OR FAMILY LEAVE FORMS, YOU MUST BRING THEM TO THE OFFICE FOR PROCESSING.   DO NOT GIVE THEM TO YOUR DOCTOR.  1. A  prescription for pain medication may be given to you upon discharge.  Take your pain medication as prescribed, if needed.  If narcotic pain medicine is not needed, then you may take acetaminophen (Tylenol) or ibuprofen (Advil) as needed. 2. Take your usually prescribed medications unless otherwise directed. If you need a refill on your pain medication, please contact your pharmacy.  They will contact our office to request authorization. Prescriptions will not be filled after 5 pm or on week-ends. 3. You should follow a light diet the first 24 hours after arrival home, such as soup and crackers, etc.  Be sure to include lots of fluids daily.  Resume your normal diet the day after surgery. 4.Most patients will experience some swelling and bruising around the umbilicus or in the groin and scrotum.  Ice packs and reclining will help.  Swelling and bruising can take several days to resolve.  6. It is common to experience some constipation if taking pain medication after surgery.  Increasing fluid intake and taking a stool softener (such as Colace) will usually help or prevent this problem from occurring.  A mild laxative (Milk of Magnesia or Miralax) should be taken according to package directions if there are no bowel movements after 48 hours. 7. Unless discharge instructions indicate otherwise, you may remove your bandages 24-48 hours after surgery, and you may shower at that time.  You may have steri-strips (small skin tapes) in place directly over the incision.  These strips should be left on the skin for 7-10 days.  If your surgeon used skin glue on the  incision, you may shower in 24 hours.  The glue will flake off over the next 2-3 weeks.  Any sutures or staples will be removed at the office during your follow-up visit. 8. ACTIVITIES:  You may resume regular (light) daily activities beginning the next day--such as daily self-care, walking, climbing stairs--gradually increasing activities as tolerated.  You may have sexual intercourse when it is comfortable.  Refrain from any heavy lifting or straining until approved by your doctor.  a.You may drive when you are no longer taking prescription pain medication, you can comfortably wear a seatbelt, and you can safely maneuver your car and apply brakes.   9.You should see your doctor in the office for a follow-up appointment approximately 4-6 weeks after your surgery.  Make sure that you call for this appointment within a day or two after you arrive home to insure a convenient appointment time.  WHEN TO CALL YOUR DOCTOR: Fever over 101.0 Inability to urinate Nausea and/or vomiting Extreme swelling or bruising Continued bleeding from incision. Increased pain, redness, or drainage from the incision  The clinic staff is available to answer your questions during regular business hours.  Please don't hesitate to call and ask to speak to one of the nurses for clinical concerns.  If you have a medical emergency, go to the nearest emergency room or call 911.  A surgeon from [facility] is always on call at the hospital   [address], [place], [place], [place]  [address] ?  [address] [address], [place], [place]  [address] [phone] ? [phone] ? FAX [phone] Web site: [email or web address]  

## 2022-04-09 NOTE — ED Notes (Signed)
EDP [place] at bedside

## 2022-04-09 NOTE — Anesthesia Postprocedure Evaluation (Signed)
Anesthesia Post Note  Patient: Tommy [name]  Procedure(s) Performed: HERNIA REPAIR INGUINAL ADULT (Right)     Patient location during evaluation: PACU Anesthesia Type: General and Regional Level of consciousness: awake and alert Pain management: pain level controlled Vital Signs Assessment: post-procedure vital signs reviewed and stable Respiratory status: spontaneous breathing, nonlabored ventilation, respiratory function stable and patient connected to nasal cannula oxygen Cardiovascular status: blood pressure returned to baseline and stable Postop Assessment: no apparent nausea or vomiting Anesthetic complications: no   No notable events documented.  Last Vitals:  Vitals:   04/09/22 1430 04/09/22 1513  BP: 126/75 124/74  Pulse: (!) 49 (!) 50  Resp: 17 17  Temp: 36.8 C 36.6 C  SpO2: 100% 100%    Last Pain:  Vitals:   04/09/22 1513  TempSrc: Oral  PainSc: 0-No pain                 Quadre Bristol S

## 2022-04-09 NOTE — ED Notes (Signed)
Pts arrival to ED room 17 reported to [place]. Informed EDP pt appears to be highly uncomfortable.

## 2022-04-09 NOTE — ED Triage Notes (Signed)
Pt BIB EMS with reports of sharp abdominal pain to all 4 quads and nausea since yesterday.  18 g left hand

## 2022-04-10 ENCOUNTER — Other Ambulatory Visit (HOSPITAL_COMMUNITY): Payer: Self-pay

## 2022-04-10 ENCOUNTER — Encounter (HOSPITAL_COMMUNITY): Payer: Self-pay | Admitting: Surgery

## 2022-04-10 LAB — CBC
HCT: 33.1 % — ABNORMAL LOW (ref 39.0–52.0)
Hemoglobin: 11.4 g/dL — ABNORMAL LOW (ref 13.0–17.0)
MCH: 32.4 pg (ref 26.0–34.0)
MCHC: 34.4 g/dL (ref 30.0–36.0)
MCV: 94 fL (ref 80.0–100.0)
Platelets: 115 10*3/uL — ABNORMAL LOW (ref 150–400)
RBC: 3.52 MIL/uL — ABNORMAL LOW (ref 4.22–5.81)
RDW: 12.3 % (ref 11.5–15.5)
WBC: 8.6 10*3/uL (ref 4.0–10.5)
nRBC: 0 % (ref 0.0–0.2)

## 2022-04-10 LAB — BASIC METABOLIC PANEL
Anion gap: 5 (ref 5–15)
BUN: 13 mg/dL (ref 8–23)
CO2: 27 mmol/L (ref 22–32)
Calcium: 8.4 mg/dL — ABNORMAL LOW (ref 8.9–10.3)
Chloride: 106 mmol/L (ref 98–111)
Creatinine, Ser: 0.73 mg/dL (ref 0.61–1.24)
GFR, Estimated: >60 mL/min (ref >60)
Glucose, Bld: 114 mg/dL — ABNORMAL HIGH (ref 70–99)
Potassium: 3.7 mmol/L (ref 3.5–5.1)
Sodium: 138 mmol/L (ref 135–145)

## 2022-04-10 MED ORDER — TRAMADOL HCL 50 MG PO TABS
50.0000 mg | ORAL_TABLET | Freq: Four times a day (QID) | ORAL | 0 refills | Status: AC | PRN
  Filled 2022-04-10: qty 20, 5d supply, fill #0

## 2022-04-10 MED ORDER — METOPROLOL TARTRATE 50 MG PO TABS
150.0000 mg | ORAL_TABLET | Freq: Two times a day (BID) | ORAL | Status: DC
  Administered 2022-04-10: 150 mg via ORAL
  Filled 2022-04-10: qty 3

## 2022-04-10 MED ORDER — CLORAZEPATE DIPOTASSIUM 3.75 MG PO TABS
3.7500 mg | ORAL_TABLET | Freq: Two times a day (BID) | ORAL | Status: DC | PRN
  Filled 2022-04-10: qty 1

## 2022-04-10 NOTE — Progress Notes (Signed)
  Transition of Care [facility]) Screening Note   Patient Details  Name: Tommy [name] Date of Birth: 17-Mar-1951   Transition of [facility]) CM/SW Contact:    Lennart Pall, LCSW Phone Number: 04/10/2022, 10:51 AM    Transition of Care Department [facility]) has reviewed patient and no TOC needs have been identified at this time. We will continue to monitor patient advancement through interdisciplinary progression rounds. If new patient transition needs arise, please place a TOC consult.

## 2022-04-10 NOTE — Discharge Summary (Signed)
Physician Discharge Summary  Patient ID: Tommy [name] MRN: [id number] DOB/AGE: June 06, 1951 71 y.o.  Admit date: 04/09/2022 Discharge date: 04/10/2022  Admission Diagnoses:  Discharge Diagnoses:  Incarcerated right inguinal hernia with obstruction A-fib  Discharged Condition: good  Hospital Course: uneventful post op recovery.  Patient discharged home POD#1 doing well  Consults: None  Significant Diagnostic Studies:   Treatments: open right inguinal hernia repair with mesh  Discharge Exam: Blood pressure 114/64, pulse (!) 48, temperature (!) 97.5 F (36.4 C), temperature source Oral, resp. rate 18, height '6\' 8"'$  (2.032 m), weight 90.7 kg, SpO2 100 %. General appearance: alert, cooperative, and no distress Resp: clear to auscultation bilaterally Incision/Wound: abdomen soft, incision clean  Disposition: Discharge disposition: 01-Home or Self Care        Allergies as of 04/10/2022       Reactions   Ibuprofen    Sleepwalking   Paxil [paroxetine Hcl] Other (See Comments)   Droopy face/disoriented.   Sudafed [pseudoephedrine]    Causes a "rebound headache"         Medication List     TAKE these medications    acetaminophen 325 MG tablet Commonly known as: TYLENOL Take 2 tablets (650 mg total) by mouth every 4 (four) hours as needed for headache or mild pain.   apixaban 5 MG Tabs tablet Commonly known as: Eliquis Take 1 tablet (5 mg total) by mouth 2 (two) times daily. What changed:  how much to take how to take this when to take this additional instructions   bismuth subsalicylate 025 KY/70WC suspension Commonly known as: PEPTO BISMOL Take 15 mLs by mouth every 6 (six) hours as needed for indigestion.   calcium carbonate 500 MG chewable tablet Commonly known as: TUMS - dosed in mg elemental calcium Chew 500 mg by mouth daily as needed for indigestion or heartburn.   chlorpheniramine 4 MG tablet Commonly known as: CHLOR-TRIMETON Take 2-4 mg by  mouth daily as needed for allergies.   clorazepate 3.75 MG tablet Commonly known as: TRANXENE Take 3.75 mg by mouth 2 (two) times daily as needed for anxiety.   latanoprost 0.005 % ophthalmic solution Commonly known as: XALATAN Place 1 drop into both eyes as directed. Alternates 1 drop in each eye daily every 36 hours   metoprolol tartrate 50 MG tablet Commonly known as: LOPRESSOR TAKE 2 & 1/2 (TWO & ONE-HALF) TABLETS BY MOUTH IN THE MORNING, NOON AT BEDTIME KEEP UPCOMING APPOINTMENT What changed:  how much to take how to take this when to take this additional instructions   nystatin ointment Commonly known as: MYCOSTATIN Apply 1 application  topically every other day.   Omeprazole 20 MG Tbec Take 10 mg by mouth daily as needed for constipation or heartburn.   sertraline 50 MG tablet Commonly known as: ZOLOFT Take 25-50 mg by mouth daily.   tolnaftate 1 % cream Commonly known as: TINACTIN Apply 1 Application topically daily as needed (rash).   traMADol 50 MG tablet Commonly known as: Ultram Take 1 tablet (50 mg total) by mouth every 6 (six) hours as needed for moderate pain or severe pain.        Follow-up Information     Coralie Keens, MD. Go on 05/15/2022.   Specialty: General Surgery Why: 9:40 AM. Please arrive 30 min prior to appointment time. Have ID and insurance card with you. Contact information: [place] [address] [phone]  Signed: Coralie Keens 04/10/2022, 8:34 AM

## 2022-04-10 NOTE — Progress Notes (Signed)
Nurse reviewed discharge orders with pt.  Pt verbalized understanding of discharge instructions, follow up appointments and new medications.  Pt to call a taxi to take him home at discharge.

## 2022-04-10 NOTE — Progress Notes (Signed)
Patient ID: Tommy [name], male   DOB: November 08, 1950, 71 y.o.   MRN: [id number]  Doing well Discharge home today after lunch

## 2022-05-12 DIAGNOSIS — Z1212 Encounter for screening for malignant neoplasm of rectum: Secondary | ICD-10-CM | POA: Diagnosis not present

## 2022-05-12 DIAGNOSIS — Z1211 Encounter for screening for malignant neoplasm of colon: Secondary | ICD-10-CM | POA: Diagnosis not present

## 2022-05-20 LAB — COLOGUARD: COLOGUARD: POSITIVE — AB

## 2022-06-03 DIAGNOSIS — H401131 Primary open-angle glaucoma, bilateral, mild stage: Secondary | ICD-10-CM | POA: Diagnosis not present

## 2022-06-15 DIAGNOSIS — K460 Unspecified abdominal hernia with obstruction, without gangrene: Secondary | ICD-10-CM | POA: Diagnosis not present

## 2022-06-15 DIAGNOSIS — R195 Other fecal abnormalities: Secondary | ICD-10-CM | POA: Diagnosis not present

## 2022-06-15 DIAGNOSIS — I4891 Unspecified atrial fibrillation: Secondary | ICD-10-CM | POA: Diagnosis not present

## 2022-06-16 DIAGNOSIS — H04123 Dry eye syndrome of bilateral lacrimal glands: Secondary | ICD-10-CM | POA: Diagnosis not present

## 2022-06-24 ENCOUNTER — Telehealth: Payer: Self-pay | Admitting: *Deleted

## 2022-06-24 NOTE — Telephone Encounter (Signed)
   Pre-operative Risk Assessment    Patient Name: Tommy [name]  DOB: 1951-03-01 MRN: [id number]      Request for Surgical Clearance    Procedure:   COLONOSCOPY  Date of Surgery:  Clearance 07/08/22                                 Surgeon:  DR. HUNG Surgeon's Group or Practice Name:  [facility] Phone number:  [phone] Fax number:  [phone]   Type of Clearance Requested:   - Medical  - Pharmacy:  Hold Apixaban (Eliquis)     Type of Anesthesia:   PROPOFOL   Additional requests/questions:    Jiles Prows   06/24/2022, 11:46 AM

## 2022-06-25 ENCOUNTER — Telehealth: Payer: Self-pay | Admitting: *Deleted

## 2022-06-25 NOTE — Telephone Encounter (Signed)
Pt agreeable to plan of care for tele pre op appt  07/02/22 @ 9:40. Med rec and consent are done.      Patient Consent for Virtual Visit        Tommy [name] has provided verbal consent on 06/25/2022 for a virtual visit (video or telephone).   CONSENT FOR VIRTUAL VISIT FOR:  Tommy [name]  By participating in this virtual visit I agree to the following:  I hereby voluntarily request, consent and authorize [facility] and its employed or contracted physicians, physician assistants, nurse practitioners or other licensed health care professionals (the Practitioner), to provide me with telemedicine health care services (the "Services") as deemed necessary by the treating Practitioner. I acknowledge and consent to receive the Services by the Practitioner via telemedicine. I understand that the telemedicine visit will involve communicating with the Practitioner through live audiovisual communication technology and the disclosure of certain medical information by electronic transmission. I acknowledge that I have been given the opportunity to request an in-person assessment or other available alternative prior to the telemedicine visit and am voluntarily participating in the telemedicine visit.  I understand that I have the right to withhold or withdraw my consent to the use of telemedicine in the course of my care at any time, without affecting my right to future care or treatment, and that the Practitioner or I may terminate the telemedicine visit at any time. I understand that I have the right to inspect all information obtained and/or recorded in the course of the telemedicine visit and may receive copies of available information for a reasonable fee.  I understand that some of the potential risks of receiving the Services via telemedicine include:  Delay or interruption in medical evaluation due to technological equipment failure or disruption; Information transmitted may not be  sufficient (e.g. poor resolution of images) to allow for appropriate medical decision making by the Practitioner; and/or  In rare instances, security protocols could fail, causing a breach of personal health information.  Furthermore, I acknowledge that it is my responsibility to provide information about my medical history, conditions and care that is complete and accurate to the best of my ability. I acknowledge that Practitioner's advice, recommendations, and/or decision may be based on factors not within their control, such as incomplete or inaccurate data provided by me or distortions of diagnostic images or specimens that may result from electronic transmissions. I understand that the practice of medicine is not an exact science and that Practitioner makes no warranties or guarantees regarding treatment outcomes. I acknowledge that a copy of this consent can be made available to me via my patient portal ([facility]), or I can request a printed copy by calling the office of [facility].    I understand that my insurance will be billed for this visit.   I have read or had this consent read to me. I understand the contents of this consent, which adequately explains the benefits and risks of the Services being provided via telemedicine.  I have been provided ample opportunity to ask questions regarding this consent and the Services and have had my questions answered to my satisfaction. I give my informed consent for the services to be provided through the use of telemedicine in my medical care

## 2022-06-25 NOTE — Telephone Encounter (Signed)
   Name: Tommy [name]  DOB: May 18, 1951  MRN: [id number]  Primary Cardiologist: Glenetta Hew, MD   Preoperative team, please contact this patient and set up a phone call appointment for further preoperative risk assessment. Please obtain consent and complete medication review. Thank you for your help.  I confirm that guidance regarding antiplatelet and oral anticoagulation therapy has been completed and, if necessary, noted below.   Deberah Pelton, NP 06/25/2022, 10:03 AM [place]

## 2022-06-25 NOTE — Telephone Encounter (Signed)
Pt agreeable to plan of care for tele pre op appt  07/02/22 @ 9:40. Med rec and consent are done.

## 2022-06-25 NOTE — Telephone Encounter (Signed)
Patient with diagnosis of PAF on Eliquis for anticoagulation.  Last seen 08/12/21  Procedure: colonoscopy Date of procedure: 07/08/22   CHA2DS2-VASc Score = 2  This indicates a 2.2% annual risk of stroke. The patient's score is based upon: CHF History: 0 HTN History: 1 Diabetes History: 0 Stroke History: 0 Vascular Disease History: 0 Age Score: 1 Gender Score: 0   CrCl 15m/min Platelet count 115K   Per office protocol, patient can hold Eliquis for 2 days prior to procedure.     **This guidance is not considered finalized until pre-operative APP has relayed final recommendations.**

## 2022-07-02 ENCOUNTER — Ambulatory Visit: Payer: Medicare HMO | Attending: Cardiology | Admitting: Physician Assistant

## 2022-07-02 DIAGNOSIS — Z01810 Encounter for preprocedural cardiovascular examination: Secondary | ICD-10-CM | POA: Diagnosis not present

## 2022-07-02 NOTE — Progress Notes (Signed)
Virtual Visit via Telephone Note   Because of Tommy [name] co-morbid illnesses, he is at least at moderate risk for complications without adequate follow up.  This format is felt to be most appropriate for this patient at this time.  The patient did not have access to video technology/had technical difficulties with video requiring transitioning to audio format only (telephone).  All issues noted in this document were discussed and addressed.  No physical exam could be performed with this format.  Please refer to the patient's chart for his consent to telehealth for [facility].  Evaluation Performed:  Preoperative cardiovascular risk assessment _____________   Date:  07/02/2022   Patient ID:  Tommy [name], Tommy [name] 20-Oct-1950, MRN [id number] Patient Location:  Home Provider location:   Office  Primary Care Provider:  Merrilee Seashore, MD Primary Cardiologist:  Tommy Hew, MD  Chief Complaint / Patient Profile   71 y.o. y/o male with a h/o atrial fibrillation, prostate cancer, anxiety, depression, hypertension, postoperative nausea/vomiting who is pending colonoscopy and presents today for telephonic preoperative cardiovascular risk assessment.  Past Medical History    Past Medical History:  Diagnosis Date   Anxiety    Asthma    ASTHMA AS A CHILD - NO PROBLEMS NOW   Atrial flutter with controlled response ([place]) 04/02/2015   Bone tumor    BONE TUMORS LEFT UPPER ARM, ABOVE LEFT KNEE AND MAYBE IN JAWS --TOLD BENIGN    Cancer (HCC)    PROSTATE CANCER   Depression    GERD (gastroesophageal reflux disease)    NOT A RECENT PROBLEM - BUT DID TAKE OMEPRAZOLE COUPLE OF MONTHS AGO    Headache(784.0)    MIGRAINES   Heart murmur    TOLD HE HAS A SLIGHT HEART MURMUR   Hypertension    Pain    HX OF LOWER BACK PAIN AND RUPTURED DISK 2001 -BUT NO SURGERY   Palpitations    Paroxysmal atrial fibrillation (HCC)    PONV (postoperative nausea and vomiting)    Past  Surgical History:  Procedure Laterality Date   BENIGN SKIN TUMOR EXCISED LEFT ARMPIT  ? 1972     BONE TUMOR EXCISION  1973   LEFT LEG   HERNIA REPAIR     RIGHT ING 1968 AND LEFT ING 1994 WITH MESH   INGUINAL HERNIA REPAIR Right 04/09/2022   Procedure: HERNIA REPAIR INGUINAL ADULT;  Surgeon: Tommy Keens, MD;  Location: [facility];  Service: General;  Laterality: Right;  with mesh    ROBOT ASSISTED LAPAROSCOPIC RADICAL PROSTATECTOMY N/A 04/13/2013   Procedure: ROBOTIC ASSISTED LAPAROSCOPIC RADICAL PROSTATECTOMY LEVEL 3;  Surgeon: Tommy Gray, MD;  Location: [facility];  Service: Urology;  Laterality: N/A;   TRANSTHORACIC ECHOCARDIOGRAM  04/03/2015   EF 55 and 60%. No regional WMA. Mild MR. Mildly dilated ascending aorta (4.5 mm in the aortic root). Mild RA dilation.    Allergies  Allergies  Allergen Reactions   Ibuprofen     Sleepwalking   Paxil [Paroxetine Hcl] Other (See Comments)    Droopy face/disoriented.   Sudafed [Pseudoephedrine]     Causes a "rebound headache"     History of Present Illness    Tommy [name] is a 71 y.o. male who presents via audio/video conferencing for a telehealth visit today.  Pt was last seen in cardiology clinic on 08/12/2021 by Tommy Memos, NP.  At that time Tommy [name] was doing well .  The patient is now pending procedure as outlined  above. Since his last visit, he tells me that he had a prostatectomy back in 2013 and had issues with hypotension.  He had a recent urgent hernia repair back in July and did really well.  He did not have any issues with hypotension at this time.  Overall, things have been going well from a cardiac standpoint.  He is staying on his medications and has not had too many symptoms with irregular heartbeat.  He denies chest pain or shortness of breath.  He states sometimes he has some unsteadiness when walking longer distances and uses a cane or shopping cart for balance.  This has been going on for the last few years and has been  stable.  He has no issues with walking or going up and down stairs.  He does all the indoor work and does some raking and hedge clipping outside.  He does play some golf and has gone to the driving range this year.  For this reason he scored a 6.36 METS on the DASI.  This exceeds the minimum 4 METS requirement.  Hold Eliquis x 2 days prior to procedure.  Please restart when medically safe to do so.  Home Medications    Prior to Admission medications   Medication Sig Start Date End Date Taking? Authorizing Provider  acetaminophen (TYLENOL) 325 MG tablet Take 2 tablets (650 mg total) by mouth every 4 (four) hours as needed for headache or mild pain. 04/03/15   Tommy Quan, PA-C  apixaban (ELIQUIS) 5 MG TABS tablet Take 1 tablet (5 mg total) by mouth 2 (two) times daily. Patient taking differently: Take 2.5 mg by mouth 2 (two) times daily. 12/17/21   Tommy Man, MD  bismuth subsalicylate (PEPTO BISMOL) 262 MG/15ML suspension Take 15 mLs by mouth every 6 (six) hours as needed for indigestion.    [provider]  calcium carbonate (TUMS - DOSED IN MG ELEMENTAL CALCIUM) 500 MG chewable tablet Chew 500 mg by mouth daily as needed for indigestion or heartburn.    [provider]  chlorpheniramine (CHLOR-TRIMETON) 4 MG tablet Take 2-4 mg by mouth daily as needed for allergies.    [provider]  clorazepate (TRANXENE) 3.75 MG tablet Take 3.75 mg by mouth 2 (two) times daily as needed for anxiety.    [provider]  latanoprost (XALATAN) 0.005 % ophthalmic solution Place 1 drop into both eyes as directed. Alternates 1 drop in each eye daily every 36 hours    [provider]  metoprolol tartrate (LOPRESSOR) 50 MG tablet TAKE 2 & 1/2 (TWO & ONE-HALF) TABLETS BY MOUTH IN THE MORNING, NOON AT BEDTIME KEEP UPCOMING APPOINTMENT Patient taking differently: Take 125 mg by mouth 3 (three) times daily. 07/28/21   Tommy Man, MD  nystatin ointment (MYCOSTATIN)  Apply 1 application  topically every other day. Patient not taking: Reported on 06/25/2022 01/24/15   [provider]  Omeprazole 20 MG TBEC Take 10 mg by mouth daily as needed for constipation or heartburn.    [provider]  sertraline (ZOLOFT) 50 MG tablet Take 25-50 mg by mouth daily. 03/19/22   [provider]  tolnaftate (TINACTIN) 1 % cream Apply 1 Application topically daily as needed (rash). Patient not taking: Reported on 06/25/2022    [provider]  traMADol (ULTRAM) 50 MG tablet Take 1 tablet (50 mg total) by mouth every 6 (six) hours as needed for moderate pain or severe pain. Patient not taking: Reported on 06/25/2022  04/10/22   Tommy Keens, MD    Physical Exam    Vital Signs:  Tommy [name] does not have vital signs available for review today.  Given telephonic nature of communication, physical exam is limited. AAOx3. NAD. Normal affect.  Speech and respirations are unlabored.  Accessory Clinical Findings    None  Assessment & Plan    1.  Preoperative Cardiovascular Risk Assessment:  Tommy [name] perioperative risk of a major cardiac event is 0.4% according to the Revised Cardiac Risk Index (RCRI).  Therefore, he is at low risk for perioperative complications.   His functional capacity is good at 6.36 METs according to the [place] Activity Status Index (DASI). Recommendations: According to ACC/AHA guidelines, no further cardiovascular testing needed.  The patient may proceed to surgery at acceptable risk.   Antiplatelet and/or Anticoagulation Recommendations:  Eliquis (Apixaban) can be held for 2 days prior to surgery.  Please resume post op when felt to be safe.     A copy of this note will be routed to requesting surgeon.  Time:   Today, I have spent 15 minutes with the patient with telehealth technology discussing medical history, symptoms, and management plan.     Elgie Collard, PA-C  07/02/2022, 9:43 AM

## 2022-07-27 DIAGNOSIS — D6869 Other thrombophilia: Secondary | ICD-10-CM | POA: Diagnosis not present

## 2022-07-27 DIAGNOSIS — N182 Chronic kidney disease, stage 2 (mild): Secondary | ICD-10-CM | POA: Diagnosis not present

## 2022-07-27 DIAGNOSIS — D692 Other nonthrombocytopenic purpura: Secondary | ICD-10-CM | POA: Diagnosis not present

## 2022-07-27 DIAGNOSIS — Z23 Encounter for immunization: Secondary | ICD-10-CM | POA: Diagnosis not present

## 2022-07-27 DIAGNOSIS — Z125 Encounter for screening for malignant neoplasm of prostate: Secondary | ICD-10-CM | POA: Diagnosis not present

## 2022-07-27 DIAGNOSIS — Z0000 Encounter for general adult medical examination without abnormal findings: Secondary | ICD-10-CM | POA: Diagnosis not present

## 2022-07-27 DIAGNOSIS — K219 Gastro-esophageal reflux disease without esophagitis: Secondary | ICD-10-CM | POA: Diagnosis not present

## 2022-07-27 DIAGNOSIS — F321 Major depressive disorder, single episode, moderate: Secondary | ICD-10-CM | POA: Diagnosis not present

## 2022-07-27 DIAGNOSIS — I4891 Unspecified atrial fibrillation: Secondary | ICD-10-CM | POA: Diagnosis not present

## 2022-07-27 DIAGNOSIS — F419 Anxiety disorder, unspecified: Secondary | ICD-10-CM | POA: Diagnosis not present

## 2022-08-03 DIAGNOSIS — K219 Gastro-esophageal reflux disease without esophagitis: Secondary | ICD-10-CM | POA: Diagnosis not present

## 2022-08-03 DIAGNOSIS — Z0000 Encounter for general adult medical examination without abnormal findings: Secondary | ICD-10-CM | POA: Diagnosis not present

## 2022-08-03 DIAGNOSIS — I4891 Unspecified atrial fibrillation: Secondary | ICD-10-CM | POA: Diagnosis not present

## 2022-08-03 DIAGNOSIS — D692 Other nonthrombocytopenic purpura: Secondary | ICD-10-CM | POA: Diagnosis not present

## 2022-08-03 DIAGNOSIS — F321 Major depressive disorder, single episode, moderate: Secondary | ICD-10-CM | POA: Diagnosis not present

## 2022-08-03 DIAGNOSIS — N182 Chronic kidney disease, stage 2 (mild): Secondary | ICD-10-CM | POA: Diagnosis not present

## 2022-08-03 DIAGNOSIS — D6869 Other thrombophilia: Secondary | ICD-10-CM | POA: Diagnosis not present

## 2022-08-03 DIAGNOSIS — F419 Anxiety disorder, unspecified: Secondary | ICD-10-CM | POA: Diagnosis not present

## 2022-08-03 DIAGNOSIS — Z125 Encounter for screening for malignant neoplasm of prostate: Secondary | ICD-10-CM | POA: Diagnosis not present

## 2022-08-11 DIAGNOSIS — H31001 Unspecified chorioretinal scars, right eye: Secondary | ICD-10-CM | POA: Diagnosis not present

## 2022-08-11 DIAGNOSIS — H401131 Primary open-angle glaucoma, bilateral, mild stage: Secondary | ICD-10-CM | POA: Diagnosis not present

## 2022-08-11 DIAGNOSIS — H2513 Age-related nuclear cataract, bilateral: Secondary | ICD-10-CM | POA: Diagnosis not present

## 2022-08-11 DIAGNOSIS — H04123 Dry eye syndrome of bilateral lacrimal glands: Secondary | ICD-10-CM | POA: Diagnosis not present

## 2022-10-06 DIAGNOSIS — Z23 Encounter for immunization: Secondary | ICD-10-CM | POA: Diagnosis not present

## 2022-10-22 DIAGNOSIS — H401131 Primary open-angle glaucoma, bilateral, mild stage: Secondary | ICD-10-CM | POA: Diagnosis not present

## 2022-11-17 DIAGNOSIS — J069 Acute upper respiratory infection, unspecified: Secondary | ICD-10-CM | POA: Diagnosis not present

## 2022-11-17 DIAGNOSIS — J029 Acute pharyngitis, unspecified: Secondary | ICD-10-CM | POA: Diagnosis not present

## 2022-12-27 ENCOUNTER — Other Ambulatory Visit: Payer: Self-pay | Admitting: Cardiology

## 2022-12-27 DIAGNOSIS — I480 Paroxysmal atrial fibrillation: Secondary | ICD-10-CM

## 2022-12-28 NOTE — Telephone Encounter (Signed)
Prescription refill request for Eliquis received. Indication: Afib  Last office visit: 07/02/22 Asa Lente) Scr: 0.73 (04/10/22)  Age: 72 Weight: 90.7kg  Appropriate dose. Refill sent.

## 2023-02-04 DIAGNOSIS — K219 Gastro-esophageal reflux disease without esophagitis: Secondary | ICD-10-CM | POA: Diagnosis not present

## 2023-02-25 DIAGNOSIS — H401131 Primary open-angle glaucoma, bilateral, mild stage: Secondary | ICD-10-CM | POA: Diagnosis not present

## 2023-04-05 DIAGNOSIS — M25569 Pain in unspecified knee: Secondary | ICD-10-CM | POA: Diagnosis not present

## 2023-04-05 DIAGNOSIS — M79652 Pain in left thigh: Secondary | ICD-10-CM | POA: Diagnosis not present

## 2023-04-05 DIAGNOSIS — M25562 Pain in left knee: Secondary | ICD-10-CM | POA: Diagnosis not present

## 2023-05-07 DIAGNOSIS — M25562 Pain in left knee: Secondary | ICD-10-CM | POA: Diagnosis not present

## 2023-05-22 DIAGNOSIS — M25562 Pain in left knee: Secondary | ICD-10-CM | POA: Diagnosis not present

## 2023-05-31 DIAGNOSIS — M25562 Pain in left knee: Secondary | ICD-10-CM | POA: Diagnosis not present

## 2023-06-03 DIAGNOSIS — H401131 Primary open-angle glaucoma, bilateral, mild stage: Secondary | ICD-10-CM | POA: Diagnosis not present

## 2023-07-08 DIAGNOSIS — Z23 Encounter for immunization: Secondary | ICD-10-CM | POA: Diagnosis not present

## 2023-07-29 DIAGNOSIS — I951 Orthostatic hypotension: Secondary | ICD-10-CM | POA: Diagnosis not present

## 2023-07-29 DIAGNOSIS — R42 Dizziness and giddiness: Secondary | ICD-10-CM | POA: Diagnosis not present

## 2023-08-03 DIAGNOSIS — H401132 Primary open-angle glaucoma, bilateral, moderate stage: Secondary | ICD-10-CM | POA: Diagnosis not present

## 2023-08-09 DIAGNOSIS — R5383 Other fatigue: Secondary | ICD-10-CM | POA: Diagnosis not present

## 2023-08-09 DIAGNOSIS — D6869 Other thrombophilia: Secondary | ICD-10-CM | POA: Diagnosis not present

## 2023-08-09 DIAGNOSIS — N182 Chronic kidney disease, stage 2 (mild): Secondary | ICD-10-CM | POA: Diagnosis not present

## 2023-08-09 DIAGNOSIS — Z125 Encounter for screening for malignant neoplasm of prostate: Secondary | ICD-10-CM | POA: Diagnosis not present

## 2023-08-09 DIAGNOSIS — K219 Gastro-esophageal reflux disease without esophagitis: Secondary | ICD-10-CM | POA: Diagnosis not present

## 2023-08-09 DIAGNOSIS — Z0000 Encounter for general adult medical examination without abnormal findings: Secondary | ICD-10-CM | POA: Diagnosis not present

## 2023-08-16 DIAGNOSIS — N182 Chronic kidney disease, stage 2 (mild): Secondary | ICD-10-CM | POA: Diagnosis not present

## 2023-08-16 DIAGNOSIS — F419 Anxiety disorder, unspecified: Secondary | ICD-10-CM | POA: Diagnosis not present

## 2023-08-16 DIAGNOSIS — R5383 Other fatigue: Secondary | ICD-10-CM | POA: Diagnosis not present

## 2023-08-16 DIAGNOSIS — D692 Other nonthrombocytopenic purpura: Secondary | ICD-10-CM | POA: Diagnosis not present

## 2023-08-16 DIAGNOSIS — F321 Major depressive disorder, single episode, moderate: Secondary | ICD-10-CM | POA: Diagnosis not present

## 2023-08-16 DIAGNOSIS — I4891 Unspecified atrial fibrillation: Secondary | ICD-10-CM | POA: Diagnosis not present

## 2023-08-16 DIAGNOSIS — D6869 Other thrombophilia: Secondary | ICD-10-CM | POA: Diagnosis not present

## 2023-08-16 DIAGNOSIS — K219 Gastro-esophageal reflux disease without esophagitis: Secondary | ICD-10-CM | POA: Diagnosis not present

## 2023-08-16 DIAGNOSIS — Z0000 Encounter for general adult medical examination without abnormal findings: Secondary | ICD-10-CM | POA: Diagnosis not present

## 2023-08-17 ENCOUNTER — Emergency Department (HOSPITAL_COMMUNITY): Payer: Medicare HMO

## 2023-08-17 ENCOUNTER — Encounter (HOSPITAL_COMMUNITY): Payer: Self-pay | Admitting: Emergency Medicine

## 2023-08-17 ENCOUNTER — Other Ambulatory Visit: Payer: Self-pay

## 2023-08-17 ENCOUNTER — Emergency Department (HOSPITAL_COMMUNITY)
Admission: EM | Admit: 2023-08-17 | Discharge: 2023-08-17 | Disposition: A | Discharge status: Home / Self Care | Payer: Medicare HMO | Attending: Emergency Medicine | Admitting: Emergency Medicine

## 2023-08-17 DIAGNOSIS — I10 Essential (primary) hypertension: Secondary | ICD-10-CM | POA: Diagnosis not present

## 2023-08-17 DIAGNOSIS — J439 Emphysema, unspecified: Secondary | ICD-10-CM | POA: Diagnosis not present

## 2023-08-17 DIAGNOSIS — R002 Palpitations: Secondary | ICD-10-CM

## 2023-08-17 DIAGNOSIS — Z7901 Long term (current) use of anticoagulants: Secondary | ICD-10-CM | POA: Insufficient documentation

## 2023-08-17 DIAGNOSIS — R030 Elevated blood-pressure reading, without diagnosis of hypertension: Secondary | ICD-10-CM

## 2023-08-17 DIAGNOSIS — I480 Paroxysmal atrial fibrillation: Secondary | ICD-10-CM | POA: Diagnosis not present

## 2023-08-17 DIAGNOSIS — I4891 Unspecified atrial fibrillation: Secondary | ICD-10-CM | POA: Diagnosis not present

## 2023-08-17 LAB — CBC
HCT: 38.7 % — ABNORMAL LOW (ref 39.0–52.0)
Hemoglobin: 12.8 g/dL — ABNORMAL LOW (ref 13.0–17.0)
MCH: 30.8 pg (ref 26.0–34.0)
MCHC: 33.1 g/dL (ref 30.0–36.0)
MCV: 93.3 fL (ref 80.0–100.0)
Platelets: 155 10*3/uL (ref 150–400)
RBC: 4.15 MIL/uL — ABNORMAL LOW (ref 4.22–5.81)
RDW: 12 % (ref 11.5–15.5)
WBC: 6.6 10*3/uL (ref 4.0–10.5)
nRBC: 0 % (ref 0.0–0.2)

## 2023-08-17 LAB — BASIC METABOLIC PANEL
Anion gap: 7 (ref 5–15)
BUN: 17 mg/dL (ref 8–23)
CO2: 27 mmol/L (ref 22–32)
Calcium: 9.1 mg/dL (ref 8.9–10.3)
Chloride: 102 mmol/L (ref 98–111)
Creatinine, Ser: 0.98 mg/dL (ref 0.61–1.24)
GFR, Estimated: >60 mL/min (ref >60)
Glucose, Bld: 99 mg/dL (ref 70–99)
Potassium: 3.6 mmol/L (ref 3.5–5.1)
Sodium: 136 mmol/L (ref 135–145)

## 2023-08-17 MED ORDER — POTASSIUM CHLORIDE CRYS ER 20 MEQ PO TBCR
40.0000 meq | EXTENDED_RELEASE_TABLET | Freq: Once | ORAL | Status: AC
  Administered 2023-08-17: 40 meq via ORAL
  Filled 2023-08-17: qty 2

## 2023-08-17 NOTE — ED Provider Triage Note (Signed)
Emergency Medicine Provider Triage Evaluation Note  Tommy [name] , a 72 y.o. male  was evaluated in triage.  Pt complains of feelings of palpitations and shortness of breath ongoing for past 2 weeks on and off.  Has a history of A-fib.  States this happens when his blood pressure gets higher than it should.  Denies any chest pain, dizziness.  Review of Systems  Positive: As above Negative: As above  Physical Exam  BP (!) 144/76 (BP Location: Right Arm)   Pulse (!) 35   Temp 98 F (36.7 C) (Oral)   Resp 16   SpO2 100%  Gen:   Awake, no distress   Resp:  Normal effort  MSK:   Moves extremities without difficulty  Other:  Regular rate on cardiac auscultation  Medical Decision Making  Medically screening exam initiated at 5:54 PM.  Appropriate orders placed.  NORVIN RABAS was informed that the remainder of the evaluation will be completed by another provider, this initial triage assessment does not replace that evaluation, and the importance of remaining in the ED until their evaluation is complete.     Arabella Merles, PA-C 08/17/23 1755

## 2023-08-17 NOTE — ED Provider Notes (Signed)
[place] EMERGENCY DEPARTMENT AT [facility] Provider Note   CSN: [id number] Arrival date & time: 08/17/23  1723     History  Chief Complaint  Patient presents with   Palpitations    Tommy [name] is a 72 y.o. male.  Pt with c/o palpitations in past couple weeks. Occurs intermittently, at rest, sense of heart flip-flopping. Denies persistent fast heart beat or syncope. No chest pain.  Currently feels breathing fine, at baseline. Denies cough or uri symptoms. No fever, chills or sweats. No recent change in meds, compliant w home meds. Had discussed w pcp and referred to cardiology f/u but that is not for a few weeks. No heat intolerance, sweats or wt loss. Normal appetite, normal po intake, no nvd. No swelling. Hx afib.   The history is provided by the patient and medical records.  Palpitations Associated symptoms: no back pain, no chest pain, no cough, no diaphoresis, no shortness of breath and no vomiting        Home Medications Prior to Admission medications   Medication Sig Start Date End Date Taking? Authorizing Provider  acetaminophen (TYLENOL) 325 MG tablet Take 2 tablets (650 mg total) by mouth every 4 (four) hours as needed for headache or mild pain. 04/03/15   Abelino Derrick, PA-C  apixaban (ELIQUIS) 5 MG TABS tablet TAKE 1 TABLET TWICE DAILY 12/28/22   Marykay Lex, MD  bismuth subsalicylate (PEPTO BISMOL) 262 MG/15ML suspension Take 15 mLs by mouth every 6 (six) hours as needed for indigestion.    [provider]  calcium carbonate (TUMS - DOSED IN MG ELEMENTAL CALCIUM) 500 MG chewable tablet Chew 500 mg by mouth daily as needed for indigestion or heartburn.    [provider]  chlorpheniramine (CHLOR-TRIMETON) 4 MG tablet Take 2-4 mg by mouth daily as needed for allergies.    [provider]  clorazepate (TRANXENE) 3.75 MG tablet Take 3.75 mg by mouth 2 (two) times daily as needed for anxiety.    [provider]   latanoprost (XALATAN) 0.005 % ophthalmic solution Place 1 drop into both eyes as directed. Alternates 1 drop in each eye daily every 36 hours    [provider]  metoprolol tartrate (LOPRESSOR) 50 MG tablet TAKE 2 & 1/2 (TWO & ONE-HALF) TABLETS BY MOUTH IN THE MORNING, NOON AT BEDTIME KEEP UPCOMING APPOINTMENT Patient taking differently: Take 125 mg by mouth 3 (three) times daily. 07/28/21   Marykay Lex, MD  nystatin ointment (MYCOSTATIN) Apply 1 application  topically every other day. Patient not taking: Reported on 06/25/2022 01/24/15   [provider]  Omeprazole 20 MG TBEC Take 10 mg by mouth daily as needed for constipation or heartburn.    [provider]  sertraline (ZOLOFT) 50 MG tablet Take 25-50 mg by mouth daily. 03/19/22   [provider]  tolnaftate (TINACTIN) 1 % cream Apply 1 Application topically daily as needed (rash). Patient not taking: Reported on 06/25/2022    [provider]  traMADol (ULTRAM) 50 MG tablet Take 1 tablet (50 mg total) by mouth every 6 (six) hours as needed for moderate pain or severe pain. Patient not taking: Reported on 06/25/2022 04/10/22   Abigail Miyamoto, MD      Allergies    Ibuprofen, Paxil [paroxetine hcl], and Sudafed [pseudoephedrine]    Review of Systems   Review of Systems  Constitutional:  Negative for chills, diaphoresis and fever.  HENT:  Negative for sore throat.  Respiratory:  Negative for cough and shortness of breath.   Cardiovascular:  Positive for palpitations. Negative for chest pain and leg swelling.  Gastrointestinal:  Negative for abdominal pain and vomiting.  Genitourinary:  Negative for dysuria.  Musculoskeletal:  Negative for back pain and neck pain.  Neurological:  Negative for headaches.    Physical Exam Updated Vital Signs BP 129/81 (BP Location: Right Arm)   Pulse (!) 50   Temp 98 F (36.7 C) (Oral)   Resp 13   SpO2 100%  Physical Exam Vitals and nursing note  reviewed.  Constitutional:      Appearance: Normal appearance. He is well-developed.  HENT:     Head: Atraumatic.     Nose: Nose normal.     Mouth/Throat:     Mouth: Mucous membranes are moist.     Pharynx: Oropharynx is clear.  Eyes:     General: No scleral icterus.    Conjunctiva/sclera: Conjunctivae normal.  Neck:     Trachea: No tracheal deviation.     Comments: Trachea midline. Thyroid not grossly enlarged or tender.  Cardiovascular:     Rate and Rhythm: Normal rate and regular rhythm.     Pulses: Normal pulses.     Heart sounds: Normal heart sounds. No murmur heard.    No friction rub. No gallop.  Pulmonary:     Effort: Pulmonary effort is normal. No accessory muscle usage or respiratory distress.     Breath sounds: Normal breath sounds.  Abdominal:     General: There is no distension.     Palpations: Abdomen is soft.     Tenderness: There is no abdominal tenderness.  Musculoskeletal:        General: No swelling or tenderness.     Cervical back: Normal range of motion and neck supple. No rigidity.     Right lower leg: No edema.     Left lower leg: No edema.  Skin:    General: Skin is warm and dry.     Findings: No rash.  Neurological:     Mental Status: He is alert.     Comments: Alert, speech clear. Motor/sens grossly intact bil.   Psychiatric:        Mood and Affect: Mood normal.     ED Results / Procedures / Treatments   Labs (all labs ordered are listed, but only abnormal results are displayed) Results for orders placed or performed during the hospital encounter of 08/17/23  Basic metabolic panel  Result Value Ref Range   Sodium 136 135 - 145 mmol/L   Potassium 3.6 3.5 - 5.1 mmol/L   Chloride 102 98 - 111 mmol/L   CO2 27 22 - 32 mmol/L   Glucose, Bld 99 70 - 99 mg/dL   BUN 17 8 - 23 mg/dL   Creatinine, Ser 0.10 0.61 - 1.24 mg/dL   Calcium 9.1 8.9 - 27.2 mg/dL   GFR, Estimated >53 >66 mL/min   Anion gap 7 5 - 15  CBC  Result Value Ref Range   WBC  6.6 4.0 - 10.5 K/uL   RBC 4.15 (L) 4.22 - 5.81 MIL/uL   Hemoglobin 12.8 (L) 13.0 - 17.0 g/dL   HCT 44.0 (L) 34.7 - 42.5 %   MCV 93.3 80.0 - 100.0 fL   MCH 30.8 26.0 - 34.0 pg   MCHC 33.1 30.0 - 36.0 g/dL   RDW 95.6 38.7 - 56.4 %   Platelets 155 150 - 400 K/uL   nRBC 0.0  0.0 - 0.2 %   DG Chest 1 View  Result Date: 08/17/2023 CLINICAL DATA:  Atrial fibrillation. EXAM: CHEST  1 VIEW COMPARISON:  Chest radiograph dated 04/13/2018. FINDINGS: No focal consolidation, pleural effusion, or pneumothorax. Background of emphysema. The cardiac silhouette is within normal limits. No acute osseous pathology. IMPRESSION: 1. No active disease. 2. Emphysema. Electronically Signed   By: Elgie Collard M.D.   On: 08/17/2023 19:45    EKG EKG Interpretation Date/Time:  Tuesday August 17 2023 17:11:28 EST Ventricular Rate:  64 PR Interval:  192 QRS Duration:  110 QT Interval:  434 QTC Calculation: 447 R Axis:   72  Text Interpretation: Sinus rhythm with occasional Premature ventricular complexes Incomplete right bundle branch block Non-specific ST-t changes Confirmed by Cathren Laine ([id number]) on 08/17/2023 8:05:16 PM  Radiology DG Chest 1 View  Result Date: 08/17/2023 CLINICAL DATA:  Atrial fibrillation. EXAM: CHEST  1 VIEW COMPARISON:  Chest radiograph dated 04/13/2018. FINDINGS: No focal consolidation, pleural effusion, or pneumothorax. Background of emphysema. The cardiac silhouette is within normal limits. No acute osseous pathology. IMPRESSION: 1. No active disease. 2. Emphysema. Electronically Signed   By: Elgie Collard M.D.   On: 08/17/2023 19:45    Procedures Procedures    Medications Ordered in ED Medications  potassium chloride SA (KLOR-CON M) CR tablet 40 mEq (40 mEq Oral Given 08/17/23 2039)    ED Course/ Medical Decision Making/ A&P                                 Medical Decision Making Problems Addressed: Elevated blood pressure reading: acute illness or  injury Essential hypertension: chronic illness or injury with exacerbation, progression, or side effects of treatment that poses a threat to life or bodily functions Palpitations: acute illness or injury with systemic symptoms that poses a threat to life or bodily functions Paroxysmal atrial fibrillation (HCC): chronic illness or injury with exacerbation, progression, or side effects of treatment that poses a threat to life or bodily functions  Amount and/or Complexity of Data Reviewed External Data Reviewed: notes. Labs: ordered. Decision-making details documented in ED Course. Radiology: ordered and independent interpretation performed. Decision-making details documented in ED Course. ECG/medicine tests: ordered and independent interpretation performed. Decision-making details documented in ED Course.  Risk Prescription drug management. Decision regarding hospitalization.   Iv ns. Continuous pulse ox and cardiac monitoring. Labs ordered/sent. Imaging ordered.   Differential diagnosis includes afib, dysrhythmia, pvc, etc. Dispo decision including potential need for admission considered - will get labs and imaging and reassess.   Reviewed nursing notes and prior charts for additional history. External reports reviewed.   Cardiac monitor: sinus rhythm, rate 60.  Labs reviewed/interpreted by me - chem normal, k borderline low, kcl po. Wbc normal, hct 39.   Xrays reviewed/interpreted by me - no pna.   Pt remains in NSR, rate 60-70, no dysrhythmia noted. No chest pain. Breathing comfortably, room air pulse ox 100%.   Pt currently appears stable for d/c.   Rec close cardiology f/u, possible outpatient monitoring.  Return precautions provided.          Final Clinical Impression(s) / ED Diagnoses Final diagnoses:  Palpitations  Paroxysmal atrial fibrillation (HCC)  Elevated blood pressure reading  Essential hypertension    Rx / DC Orders ED Discharge Orders     None          Cathren Laine, MD 08/17/23 2119

## 2023-08-17 NOTE — Discharge Instructions (Addendum)
It was our pleasure to provide your ER care today - we hope that you feel better. Your heart rhythm appears normal during your ED stay.   Continue your home meds. Limit caffeine use. Drink plenty of fluids/stay adequately hydrated. Eat plenty of fruits and vegetables.   Follow up closely with cardiologist in the next 1-2 weeks. Tonight, your blood pressure is mildly high, but does not need acutely lowering - also discuss blood pressure management at follow up with cardiologist.   Return  to ER if worse, new symptoms, fevers, recurrent or persistent chest pain, increased trouble breathing, persistent fast heart beating or rapid heart rate, fainting, or other concern.

## 2023-08-17 NOTE — ED Triage Notes (Signed)
Pt reports palpitations and SHOB on exertion. Pt with hx of a-fib and takes Eliquis.

## 2023-08-27 ENCOUNTER — Ambulatory Visit: Payer: Medicare HMO | Admitting: Adult Health

## 2023-09-27 NOTE — Progress Notes (Signed)
 Cardiology Office Note:  .   Date:  09/30/2023  ID:  Tommy [name], DOB 10-Aug-1951, MRN [id number] PCP: Verdia Lombard, MD  [facility] Providers Cardiologist:  Alm Clay, MD  }   History of Present Illness: Tommy [name] is a 73 y.o. male h/o atrial fibrillation, prostate cancer, anxiety, depression, hypertension, postoperative nausea/vomiting. Last seen by Orren Fabry, PA for pre-operative evaluation for colonoscopy. The patient remained on Eliquis .   Since being seen last in November 2024, the patient received the shingles shot (#1) on 08/09/2023.  On 08/14/2023 he felt dizzy lightheaded and unable to stand.  He was in his recliner and crawled to his bed where he was able to lay down.  He felt he was in and out of atrial fibrillation as he could feel his heart racing and skipping.  This occurred on and off throughout the day, the following day he felt better.  Went and saw his primary care for usual labs and follow-up.  He did follow-up with primary care and had physical and he felt he was in A-fib at that time.  The patient over the last several weeks has felt much better.  He has not had any issues with recurrence of racing heart rate weakness or dizziness.  He believes it was related to the shingles shot and he is choosing not to have the second dose.   He has not had any chest pain, dyspnea on exertion, fatigue, dizziness, bleeding on Eliquis  to include melena, [employer] taxis, or hemoptysis.  He is taking Eliquis  2.5 mg twice daily by cutting his 5 mg tablet in half.  He states if he takes a full tablet his legs begin to swell and be painful.  Blood pressure at home is been stable.   ROS: As above otherwise negative.  Studies Reviewed: .   Echocardiogram 04/06/2015. Left ventricle: The cavity size was normal. Systolic function was    normal. The estimated ejection fraction was in the range of 55%    to 60%. Wall motion was normal; there were no regional wall    motion  abnormalities.  - Aorta: Aortic root dimension: 45 mm (ED).  - Ascending aorta: The ascending aorta was mildly dilated.  - Mitral valve: There was mild regurgitation.  - Right ventricle: The cavity size was mildly dilated. Wall    thickness was normal.  - Right atrium: The atrium was mildly dilated.    EKG Interpretation Date/Time:  Thursday September 30 2023 11:36:56 EST Ventricular Rate:  44 PR Interval:  202 QRS Duration:  118 QT Interval:  486 QTC Calculation: 415 R Axis:   16  Text Interpretation: Marked sinus bradycardia Incomplete right bundle branch block When compared with ECG of 17-Aug-2023 17:11, Premature ventricular complexes are no longer Present Questionable change in QRS axis Confirmed by Jerilynn Collar [phone]) on 09/30/2023 12:52:01 PM    Physical Exam:   VS:  BP 104/68 (BP Location: Right Arm, Patient Position: Sitting)   Pulse (!) 46   Ht 6' 8 (2.032 m)   Wt 212 lb 12.8 oz (96.5 kg)   SpO2 99%   BMI 23.38 kg/m    Wt Readings from Last 3 Encounters:  09/30/23 212 lb 12.8 oz (96.5 kg)  04/09/22 200 lb (90.7 kg)  08/12/21 209 lb (94.8 kg)    GEN: Well nourished, well developed in no acute distress NECK: No JVD; No carotid bruits CARDIAC: RRR, bradycardic, soft systolic murmurs heard best at the  left sternal border, rubs, gallops RESPIRATORY:  Clear to auscultation without rales, wheezing or rhonchi  ABDOMEN: Soft, non-tender, non-distended EXTREMITIES:  No edema; No deformity venous stasis skin changes, dry flaky skin in the lower extremities.  ASSESSMENT AND PLAN: .    Paroxysmal atrial fibrillation: He was really feeling irregular heart rate, palpitations, racing coming and going 3 days after having shingles shot.  He was lightheaded and dizzy and had to go to bed.  He did feel better 24 hours later.  He was worried about having to go to the emergency room due to this episode but things subsided.  He was able to see his primary care provider for physical  exam annual follow-up.  He has done much better over the last several weeks and has not had any issues since that time.  He continues on metoprolol  for which she takes on to 50 mg in the morning, 150 mg at 8 PM, and 125 mg at 4 AM.  He works second shift and has a phone timer to remind him when he takes his medicines.  He is bradycardic today may need to back off on the metoprolol  dose at 4 AM if he becomes symptomatic.  He will continue on Eliquis  2.5 mg twice daily as directed.  2.  Hypertension: Blood pressure soft today.  Again may need to back off on the 4 AM dose of the metoprolol  if he remains slightly hypotensive on next office visit.  I think the 150 twice daily is all he really needs especially with the bradycardia and hypotension.  Will reevaluate on follow-up or when he sees his primary care if he becomes symptomatic.  3.  Chronic kidney disease: Followed by primary care.  Labs are also completed by primary care.  4.  Chronic venous stasis: Skin discoloration from venous stasis is noted.  May need to consider compression hose although he is not having any lower extremity edema currently.         Signed, Lamarr HERO. Jerilynn CHOL, ANP, AACC

## 2023-09-30 ENCOUNTER — Ambulatory Visit: Payer: Medicare HMO | Attending: Adult Health | Admitting: Adult Health

## 2023-09-30 ENCOUNTER — Encounter: Payer: Self-pay | Admitting: Adult Health

## 2023-09-30 VITALS — BP 104/68 | HR 46 | Ht >= 80 in | Wt 212.8 lb

## 2023-09-30 DIAGNOSIS — I340 Nonrheumatic mitral (valve) insufficiency: Secondary | ICD-10-CM

## 2023-09-30 DIAGNOSIS — D692 Other nonthrombocytopenic purpura: Secondary | ICD-10-CM | POA: Diagnosis not present

## 2023-09-30 DIAGNOSIS — I872 Venous insufficiency (chronic) (peripheral): Secondary | ICD-10-CM | POA: Diagnosis not present

## 2023-09-30 DIAGNOSIS — I878 Other specified disorders of veins: Secondary | ICD-10-CM | POA: Diagnosis not present

## 2023-09-30 DIAGNOSIS — I10 Essential (primary) hypertension: Secondary | ICD-10-CM | POA: Diagnosis not present

## 2023-09-30 DIAGNOSIS — I480 Paroxysmal atrial fibrillation: Secondary | ICD-10-CM

## 2023-09-30 NOTE — Patient Instructions (Signed)
Medication Instructions:  No Changes *If you need a refill on your cardiac medications before your next appointment, please call your pharmacy*   Lab Work: No labs If you have labs (blood work) drawn today and your tests are completely normal, you will receive your results only by: MyChart Message (if you have MyChart) OR A paper copy in the mail If you have any lab test that is abnormal or we need to change your treatment, we will call you to review the results.   Testing/Procedures: No Testing   Follow-Up: At [facility], you and your health needs are our priority.  As part of our continuing mission to provide you with exceptional heart care, we have created designated Provider Care Teams.  These Care Teams include your primary Cardiologist (physician) and Advanced Practice Providers (APPs -  Physician Assistants and Nurse Practitioners) who all work together to provide you with the care you need, when you need it.  We recommend signing up for the patient portal called "MyChart".  Sign up information is provided on this After Visit Summary.  MyChart is used to connect with patients for Virtual Visits (Telemedicine).  Patients are able to view lab/test results, encounter notes, upcoming appointments, etc.  Non-urgent messages can be sent to your provider as well.   To learn more about what you can do with MyChart, go to [email or web address].    Your next appointment:   1 year(s)  Provider:   Bryan Lemma, MD

## 2023-11-03 DIAGNOSIS — H401131 Primary open-angle glaucoma, bilateral, mild stage: Secondary | ICD-10-CM | POA: Diagnosis not present

## 2023-12-09 DIAGNOSIS — D229 Melanocytic nevi, unspecified: Secondary | ICD-10-CM | POA: Diagnosis not present

## 2023-12-09 DIAGNOSIS — B354 Tinea corporis: Secondary | ICD-10-CM | POA: Diagnosis not present

## 2024-01-13 ENCOUNTER — Encounter: Payer: Self-pay | Admitting: Radiology

## 2024-01-13 DIAGNOSIS — D485 Neoplasm of uncertain behavior of skin: Secondary | ICD-10-CM | POA: Diagnosis not present

## 2024-01-13 DIAGNOSIS — C4441 Basal cell carcinoma of skin of scalp and neck: Secondary | ICD-10-CM | POA: Diagnosis not present

## 2024-01-13 DIAGNOSIS — D225 Melanocytic nevi of trunk: Secondary | ICD-10-CM | POA: Diagnosis not present

## 2024-01-13 DIAGNOSIS — L821 Other seborrheic keratosis: Secondary | ICD-10-CM | POA: Diagnosis not present

## 2024-01-21 DIAGNOSIS — D485 Neoplasm of uncertain behavior of skin: Secondary | ICD-10-CM | POA: Diagnosis not present

## 2024-01-21 DIAGNOSIS — C44311 Basal cell carcinoma of skin of nose: Secondary | ICD-10-CM | POA: Diagnosis not present

## 2024-01-21 DIAGNOSIS — D1809 Hemangioma of other sites: Secondary | ICD-10-CM | POA: Diagnosis not present

## 2024-01-21 DIAGNOSIS — L98499 Non-pressure chronic ulcer of skin of other sites with unspecified severity: Secondary | ICD-10-CM | POA: Diagnosis not present

## 2024-02-04 DIAGNOSIS — L258 Unspecified contact dermatitis due to other agents: Secondary | ICD-10-CM | POA: Diagnosis not present

## 2024-02-17 DIAGNOSIS — H401131 Primary open-angle glaucoma, bilateral, mild stage: Secondary | ICD-10-CM | POA: Diagnosis not present

## 2024-02-21 DIAGNOSIS — I4891 Unspecified atrial fibrillation: Secondary | ICD-10-CM | POA: Diagnosis not present

## 2024-02-21 DIAGNOSIS — N182 Chronic kidney disease, stage 2 (mild): Secondary | ICD-10-CM | POA: Diagnosis not present

## 2024-02-28 DIAGNOSIS — I4891 Unspecified atrial fibrillation: Secondary | ICD-10-CM | POA: Diagnosis not present

## 2024-02-28 DIAGNOSIS — D692 Other nonthrombocytopenic purpura: Secondary | ICD-10-CM | POA: Diagnosis not present

## 2024-02-28 DIAGNOSIS — I872 Venous insufficiency (chronic) (peripheral): Secondary | ICD-10-CM | POA: Diagnosis not present

## 2024-02-28 DIAGNOSIS — N182 Chronic kidney disease, stage 2 (mild): Secondary | ICD-10-CM | POA: Diagnosis not present

## 2024-02-28 DIAGNOSIS — F321 Major depressive disorder, single episode, moderate: Secondary | ICD-10-CM | POA: Diagnosis not present

## 2024-02-28 DIAGNOSIS — F419 Anxiety disorder, unspecified: Secondary | ICD-10-CM | POA: Diagnosis not present

## 2024-02-28 DIAGNOSIS — D6869 Other thrombophilia: Secondary | ICD-10-CM | POA: Diagnosis not present

## 2024-02-28 DIAGNOSIS — K219 Gastro-esophageal reflux disease without esophagitis: Secondary | ICD-10-CM | POA: Diagnosis not present

## 2024-03-06 DIAGNOSIS — R69 Illness, unspecified: Secondary | ICD-10-CM | POA: Diagnosis not present

## 2024-03-16 DIAGNOSIS — Z08 Encounter for follow-up examination after completed treatment for malignant neoplasm: Secondary | ICD-10-CM | POA: Diagnosis not present

## 2024-03-16 DIAGNOSIS — Z85828 Personal history of other malignant neoplasm of skin: Secondary | ICD-10-CM | POA: Diagnosis not present

## 2024-03-16 DIAGNOSIS — D0472 Carcinoma in situ of skin of left lower limb, including hip: Secondary | ICD-10-CM | POA: Diagnosis not present

## 2024-03-30 DIAGNOSIS — H401131 Primary open-angle glaucoma, bilateral, mild stage: Secondary | ICD-10-CM | POA: Diagnosis not present

## 2024-04-06 DIAGNOSIS — R69 Illness, unspecified: Secondary | ICD-10-CM | POA: Diagnosis not present

## 2024-04-07 DIAGNOSIS — D0472 Carcinoma in situ of skin of left lower limb, including hip: Secondary | ICD-10-CM | POA: Diagnosis not present

## 2024-05-16 DIAGNOSIS — J387 Other diseases of larynx: Secondary | ICD-10-CM | POA: Diagnosis not present

## 2024-05-16 DIAGNOSIS — R498 Other voice and resonance disorders: Secondary | ICD-10-CM | POA: Diagnosis not present

## 2024-05-16 DIAGNOSIS — R040 Epistaxis: Secondary | ICD-10-CM | POA: Diagnosis not present

## 2024-05-16 DIAGNOSIS — R490 Dysphonia: Secondary | ICD-10-CM | POA: Diagnosis not present

## 2024-05-18 DIAGNOSIS — Z85828 Personal history of other malignant neoplasm of skin: Secondary | ICD-10-CM | POA: Diagnosis not present

## 2024-05-18 DIAGNOSIS — Z08 Encounter for follow-up examination after completed treatment for malignant neoplasm: Secondary | ICD-10-CM | POA: Diagnosis not present

## 2024-05-19 DIAGNOSIS — H401132 Primary open-angle glaucoma, bilateral, moderate stage: Secondary | ICD-10-CM | POA: Diagnosis not present

## 2024-06-09 DIAGNOSIS — M25561 Pain in right knee: Secondary | ICD-10-CM | POA: Diagnosis not present

## 2024-06-09 DIAGNOSIS — M25562 Pain in left knee: Secondary | ICD-10-CM | POA: Diagnosis not present

## 2024-07-07 DIAGNOSIS — Z23 Encounter for immunization: Secondary | ICD-10-CM | POA: Diagnosis not present

## 2024-07-20 DIAGNOSIS — H401132 Primary open-angle glaucoma, bilateral, moderate stage: Secondary | ICD-10-CM | POA: Diagnosis not present

## 2024-08-14 DIAGNOSIS — Z125 Encounter for screening for malignant neoplasm of prostate: Secondary | ICD-10-CM | POA: Diagnosis not present

## 2024-08-14 DIAGNOSIS — D6869 Other thrombophilia: Secondary | ICD-10-CM | POA: Diagnosis not present

## 2024-08-14 DIAGNOSIS — K219 Gastro-esophageal reflux disease without esophagitis: Secondary | ICD-10-CM | POA: Diagnosis not present

## 2024-08-14 DIAGNOSIS — D692 Other nonthrombocytopenic purpura: Secondary | ICD-10-CM | POA: Diagnosis not present

## 2024-08-14 DIAGNOSIS — F419 Anxiety disorder, unspecified: Secondary | ICD-10-CM | POA: Diagnosis not present

## 2024-08-14 DIAGNOSIS — I872 Venous insufficiency (chronic) (peripheral): Secondary | ICD-10-CM | POA: Diagnosis not present

## 2024-08-14 DIAGNOSIS — I4891 Unspecified atrial fibrillation: Secondary | ICD-10-CM | POA: Diagnosis not present

## 2024-08-14 DIAGNOSIS — F321 Major depressive disorder, single episode, moderate: Secondary | ICD-10-CM | POA: Diagnosis not present

## 2024-08-14 DIAGNOSIS — N182 Chronic kidney disease, stage 2 (mild): Secondary | ICD-10-CM | POA: Diagnosis not present

## 2024-08-21 DIAGNOSIS — Z0000 Encounter for general adult medical examination without abnormal findings: Secondary | ICD-10-CM | POA: Diagnosis not present

## 2024-08-21 DIAGNOSIS — D6869 Other thrombophilia: Secondary | ICD-10-CM | POA: Diagnosis not present

## 2024-08-21 DIAGNOSIS — N182 Chronic kidney disease, stage 2 (mild): Secondary | ICD-10-CM | POA: Diagnosis not present

## 2024-08-21 DIAGNOSIS — I4891 Unspecified atrial fibrillation: Secondary | ICD-10-CM | POA: Diagnosis not present

## 2024-08-21 DIAGNOSIS — F419 Anxiety disorder, unspecified: Secondary | ICD-10-CM | POA: Diagnosis not present

## 2024-08-21 DIAGNOSIS — K219 Gastro-esophageal reflux disease without esophagitis: Secondary | ICD-10-CM | POA: Diagnosis not present

## 2024-08-21 DIAGNOSIS — D692 Other nonthrombocytopenic purpura: Secondary | ICD-10-CM | POA: Diagnosis not present

## 2024-08-21 DIAGNOSIS — Z23 Encounter for immunization: Secondary | ICD-10-CM | POA: Diagnosis not present

## 2024-08-21 DIAGNOSIS — F321 Major depressive disorder, single episode, moderate: Secondary | ICD-10-CM | POA: Diagnosis not present

## 2024-08-24 ENCOUNTER — Other Ambulatory Visit: Payer: Self-pay | Admitting: Cardiology

## 2024-08-24 DIAGNOSIS — I480 Paroxysmal atrial fibrillation: Secondary | ICD-10-CM

## 2024-08-24 MED ORDER — APIXABAN 5 MG PO TABS: 5.0000 mg | ORAL_TABLET | Freq: Two times a day (BID) | ORAL | 1 refills | Status: DC

## 2024-08-24 NOTE — Telephone Encounter (Signed)
 Prescription refill request for Eliquis  received. Indication:afib Last office visit:1/25 Scr: 0.88  11/25 Age:73 Weight:96.5  kg  Prescription refilled

## 2024-09-06 ENCOUNTER — Telehealth: Payer: Self-pay | Admitting: Cardiology

## 2024-09-06 DIAGNOSIS — I480 Paroxysmal atrial fibrillation: Secondary | ICD-10-CM

## 2024-09-06 MED ORDER — APIXABAN 5 MG PO TABS: 5.0000 mg | ORAL_TABLET | Freq: Two times a day (BID) | ORAL | 1 refills | Status: DC

## 2024-09-06 NOTE — Telephone Encounter (Signed)
°*  STAT* If patient is at the pharmacy, call can be transferred to refill team.   1. Which medications need to be refilled? (please list name of each medication and dose if known)  apixaban  (ELIQUIS ) 5 MG TABS tablet   2. Would you like to learn more about the convenience, safety, & potential cost savings by using the [facility]? no    3. Are you open to using the [facility] (Type [facility] no    4. Which pharmacy/location (including street and city if local pharmacy) is medication to be sent to?  [facility] - [place], [place] - [address]   5. Do they need a 30 day or 90 day supply?  90 day

## 2024-09-06 NOTE — Telephone Encounter (Signed)
 Prescription refill request for Eliquis  received. Indication: PAF Last office visit: 09/30/23  Tommy Satterfield NP Scr: 0.88 on 08/14/24  Epic Age: 73 Weight: 96.5kg  Based on above findings Eliquis  5mg  twice daily is the appropriate dose.  Refill approved.

## 2024-09-19 ENCOUNTER — Telehealth: Payer: Self-pay | Admitting: Cardiology

## 2024-09-19 DIAGNOSIS — I480 Paroxysmal atrial fibrillation: Secondary | ICD-10-CM

## 2024-09-19 NOTE — Telephone Encounter (Signed)
" °*  STAT* If patient is at the pharmacy, call can be transferred to refill team.   1. Which medications need to be refilled? (please list name of each medication and dose if known) apixaban  (ELIQUIS ) 5 MG TABS tablet    2. Would you like to learn more about the convenience, safety, & potential cost savings by using the [facility]? No    3. Are you open to using the [facility] ([facility]. No    4. Which pharmacy/location (including street and city if local pharmacy) is medication to be sent to? [id number] - [place], [place] - [address]    5. Do they need a 30 day or 90 day supply? 90 day   Pt needs Rx send to different pharmacy.  "

## 2024-09-19 NOTE — Telephone Encounter (Signed)
 Patient just wants 30 day supply

## 2024-09-20 ENCOUNTER — Other Ambulatory Visit: Payer: Self-pay

## 2024-09-20 NOTE — Telephone Encounter (Signed)
 Patient is following up regarding this request. He says he is very dissatisfied.

## 2024-09-25 MED ORDER — APIXABAN 5 MG PO TABS: 5.0000 mg | ORAL_TABLET | Freq: Two times a day (BID) | ORAL | 0 refills | Status: AC

## 2024-09-25 NOTE — Telephone Encounter (Signed)
 Prescription refill request for Eliquis  received. Indication:   A-Fib Last office visit:   09/30/2023 Scr:    0.88  last lab on 08/14/2024 with [facility] Age:74 yrs. old Weight:   96.5 kg  Pt meets protocol and medication sent to pt's pharmacy for a 90 day supply as requested, until pt's appt in January 2026 with Dr. Anner.

## 2024-10-16 ENCOUNTER — Ambulatory Visit: Admitting: Cardiology

## 2024-11-17 ENCOUNTER — Ambulatory Visit: Admitting: Physician Assistant
